# Patient Record
Sex: Male | Born: 1955 | Race: Black or African American | Hispanic: No | Marital: Single | Smoking: Former smoker
Health system: Southern US, Community
[De-identification: ages and names within clinical notes are randomized; demographics above are authoritative.]

## PROBLEM LIST (undated history)

## (undated) DIAGNOSIS — I509 Heart failure, unspecified: Secondary | ICD-10-CM

## (undated) DIAGNOSIS — G8190 Hemiplegia, unspecified affecting unspecified side: Secondary | ICD-10-CM

## (undated) DIAGNOSIS — I2510 Atherosclerotic heart disease of native coronary artery without angina pectoris: Secondary | ICD-10-CM

## (undated) DIAGNOSIS — I252 Old myocardial infarction: Secondary | ICD-10-CM

## (undated) DIAGNOSIS — K859 Acute pancreatitis without necrosis or infection, unspecified: Secondary | ICD-10-CM

## (undated) DIAGNOSIS — E119 Type 2 diabetes mellitus without complications: Secondary | ICD-10-CM

## (undated) DIAGNOSIS — I639 Cerebral infarction, unspecified: Secondary | ICD-10-CM

---

## 2014-08-13 ENCOUNTER — Encounter (HOSPITAL_COMMUNITY): Payer: Self-pay | Admitting: Emergency Medicine

## 2014-08-13 ENCOUNTER — Emergency Department (HOSPITAL_COMMUNITY)
Admission: EM | Admit: 2014-08-13 | Discharge: 2014-08-13 | Disposition: A | Discharge status: Home / Self Care | Payer: Non-veteran care | Attending: Emergency Medicine | Admitting: Emergency Medicine

## 2014-08-13 ENCOUNTER — Emergency Department (HOSPITAL_COMMUNITY): Payer: Non-veteran care

## 2014-08-13 DIAGNOSIS — Z79899 Other long term (current) drug therapy: Secondary | ICD-10-CM | POA: Insufficient documentation

## 2014-08-13 DIAGNOSIS — Y92410 Unspecified street and highway as the place of occurrence of the external cause: Secondary | ICD-10-CM | POA: Diagnosis not present

## 2014-08-13 DIAGNOSIS — I252 Old myocardial infarction: Secondary | ICD-10-CM | POA: Diagnosis not present

## 2014-08-13 DIAGNOSIS — Z8719 Personal history of other diseases of the digestive system: Secondary | ICD-10-CM | POA: Insufficient documentation

## 2014-08-13 DIAGNOSIS — I509 Heart failure, unspecified: Secondary | ICD-10-CM | POA: Insufficient documentation

## 2014-08-13 DIAGNOSIS — Z7982 Long term (current) use of aspirin: Secondary | ICD-10-CM | POA: Insufficient documentation

## 2014-08-13 DIAGNOSIS — Y9389 Activity, other specified: Secondary | ICD-10-CM | POA: Diagnosis not present

## 2014-08-13 DIAGNOSIS — I69959 Hemiplegia and hemiparesis following unspecified cerebrovascular disease affecting unspecified side: Secondary | ICD-10-CM | POA: Insufficient documentation

## 2014-08-13 DIAGNOSIS — M545 Low back pain, unspecified: Secondary | ICD-10-CM

## 2014-08-13 DIAGNOSIS — IMO0002 Reserved for concepts with insufficient information to code with codable children: Secondary | ICD-10-CM | POA: Insufficient documentation

## 2014-08-13 DIAGNOSIS — I2510 Atherosclerotic heart disease of native coronary artery without angina pectoris: Secondary | ICD-10-CM | POA: Insufficient documentation

## 2014-08-13 DIAGNOSIS — Z87891 Personal history of nicotine dependence: Secondary | ICD-10-CM | POA: Diagnosis not present

## 2014-08-13 HISTORY — DX: Hemiplegia, unspecified affecting unspecified side: G81.90

## 2014-08-13 HISTORY — DX: Heart failure, unspecified: I50.9

## 2014-08-13 HISTORY — DX: Cerebral infarction, unspecified: I63.9

## 2014-08-13 HISTORY — DX: Acute pancreatitis without necrosis or infection, unspecified: K85.90

## 2014-08-13 HISTORY — DX: Atherosclerotic heart disease of native coronary artery without angina pectoris: I25.10

## 2014-08-13 HISTORY — DX: Old myocardial infarction: I25.2

## 2014-08-13 MED ORDER — CYCLOBENZAPRINE HCL 10 MG PO TABS
10.0000 mg | ORAL_TABLET | Freq: Once | ORAL | Status: AC
  Administered 2014-08-13: 10 mg via ORAL
  Filled 2014-08-13: qty 1

## 2014-08-13 MED ORDER — HYDROCODONE-ACETAMINOPHEN 5-325 MG PO TABS: 1.0000 | ORAL_TABLET | ORAL | Status: DC | PRN

## 2014-08-13 MED ORDER — CYCLOBENZAPRINE HCL 10 MG PO TABS: 10.0000 mg | ORAL_TABLET | Freq: Two times a day (BID) | ORAL | Status: DC | PRN

## 2014-08-13 MED ORDER — HYDROCODONE-ACETAMINOPHEN 5-325 MG PO TABS
1.0000 | ORAL_TABLET | Freq: Once | ORAL | Status: AC
  Administered 2014-08-13: 1 via ORAL
  Filled 2014-08-13: qty 1

## 2014-08-13 NOTE — ED Notes (Signed)
[facility] here to provide safe transport back to facility.  NAD upon leaving dept.  
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
## 2014-08-13 NOTE — ED Notes (Signed)
Pt ambulatory with steady gait with cane to BR.

## 2014-08-13 NOTE — ED Notes (Signed)
[facility] contacted to provide transport back to facility. 

## 2014-08-13 NOTE — ED Provider Notes (Signed)
Medical screening examination/treatment/procedure(s) were performed by non-physician practitioner and as supervising physician I was immediately available for consultation/collaboration.   EKG Interpretation None       Emauri Krygier, MD 08/13/14 2345 
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
## 2014-08-13 NOTE — Discharge Instructions (Signed)
Muscle Strain °A muscle strain is an injury that occurs when a muscle is stretched beyond its normal length. Usually a small number of muscle fibers are torn when this happens. Muscle strain is rated in degrees. First-degree strains have the least amount of muscle fiber tearing and pain. Second-degree and third-degree strains have increasingly more tearing and pain.  °Usually, recovery from muscle strain takes 1-2 weeks. Complete healing takes 5-6 weeks.  °CAUSES  °Muscle strain happens when a sudden, violent force placed on a muscle stretches it too far. This may occur with lifting, sports, or a fall.  °RISK FACTORS °Muscle strain is especially common in athletes.  °SIGNS AND SYMPTOMS °At the site of the muscle strain, there may be: °· Pain. °· Bruising. °· Swelling. °· Difficulty using the muscle due to pain or lack of normal function. °DIAGNOSIS  °Your health care provider will perform a physical exam and ask about your medical history. °TREATMENT  °Often, the best treatment for a muscle strain is resting, icing, and applying cold compresses to the injured area.   °HOME CARE INSTRUCTIONS  °· Use the PRICE method of treatment to promote muscle healing during the first 2-3 days after your injury. The PRICE method involves: °¨ Protecting the muscle from being injured again. °¨ Restricting your activity and resting the injured body part. °¨ Icing your injury. To do this, put ice in a plastic bag. Place a towel between your skin and the bag. Then, apply the ice and leave it on from 15-20 minutes each hour. After the third day, switch to moist heat packs. °¨ Apply compression to the injured area with a splint or elastic bandage. Be careful not to wrap it too tightly. This may interfere with blood circulation or increase swelling. °¨ Elevate the injured body part above the level of your heart as often as you can. °· Only take over-the-counter or prescription medicines for pain, discomfort, or fever as directed by your  health care provider. °· Warming up prior to exercise helps to prevent future muscle strains. °SEEK MEDICAL CARE IF:  °· You have increasing pain or swelling in the injured area. °· You have numbness, tingling, or a significant loss of strength in the injured area. °MAKE SURE YOU:  °· Understand these instructions. °· Will watch your condition. °· Will get help right away if you are not doing well or get worse. °Document Released: 12/12/2005 Document Revised: 10/02/2013 Document Reviewed: 07/11/2013 °Exit[employer]® Patient Information ©2015 [employer]. This information is not intended to replace advice given to you by your health care provider. Make sure you discuss any questions you have with your health care provider. °Cryotherapy °Cryotherapy means treatment with cold. Ice or gel packs can be used to reduce both pain and swelling. Ice is the most helpful within the first 24 to 48 hours after an injury or flare-up from overusing a muscle or joint. Sprains, strains, spasms, burning pain, shooting pain, and aches can all be eased with ice. Ice can also be used when recovering from surgery. Ice is effective, has very few side effects, and is safe for most people to use. °PRECAUTIONS  °Ice is not a safe treatment option for people with: °· Raynaud phenomenon. This is a condition affecting small blood vessels in the extremities. Exposure to cold may cause your problems to return. °· Cold hypersensitivity. There are many forms of cold hypersensitivity, including: °· Cold urticaria. Red, itchy hives appear on the skin when the tissues begin to warm after being   iced. °· Cold erythema. This is a red, itchy rash caused by exposure to cold. °· Cold hemoglobinuria. Red blood cells break down when the tissues begin to warm after being iced. The hemoglobin that carry oxygen are passed into the urine because they cannot combine with blood proteins fast enough. °· Numbness or altered sensitivity in the area being iced. °If you have  any of the following conditions, do not use ice until you have discussed cryotherapy with your caregiver: °· Heart conditions, such as arrhythmia, angina, or chronic heart disease. °· High blood pressure. °· Healing wounds or open skin in the area being iced. °· Current infections. °· Rheumatoid arthritis. °· Poor circulation. °· Diabetes. °Ice slows the blood flow in the region it is applied. This is beneficial when trying to stop inflamed tissues from spreading irritating chemicals to surrounding tissues. However, if you expose your skin to cold temperatures for too long or without the proper protection, you can damage your skin or nerves. Watch for signs of skin damage due to cold. °HOME CARE INSTRUCTIONS °Follow these tips to use ice and cold packs safely. °· Place a dry or damp towel between the ice and skin. A damp towel will cool the skin more quickly, so you may need to shorten the time that the ice is used. °· For a more rapid response, add gentle compression to the ice. °· Ice for no more than 10 to 20 minutes at a time. The bonier the area you are icing, the less time it will take to get the benefits of ice. °· Check your skin after 5 minutes to make sure there are no signs of a poor response to cold or skin damage. °· Rest 20 minutes or more between uses. °· Once your skin is numb, you can end your treatment. You can test numbness by very lightly touching your skin. The touch should be so light that you do not see the skin dimple from the pressure of your fingertip. When using ice, most people will feel these normal sensations in this order: cold, burning, aching, and numbness. °· Do not use ice on someone who cannot communicate their responses to pain, such as small children or people with dementia. °HOW TO MAKE AN ICE PACK °Ice packs are the most common way to use ice therapy. Other methods include ice massage, ice baths, and cryosprays. Muscle creams that cause a cold, tingly feeling do not offer the  same benefits that ice offers and should not be used as a substitute unless recommended by your caregiver. °To make an ice pack, do one of the following: °· Place crushed ice or a bag of frozen vegetables in a sealable plastic bag. Squeeze out the excess air. Place this bag inside another plastic bag. Slide the bag into a pillowcase or place a damp towel between your skin and the bag. °· Mix 3 parts water with 1 part rubbing alcohol. Freeze the mixture in a sealable plastic bag. When you remove the mixture from the freezer, it will be slushy. Squeeze out the excess air. Place this bag inside another plastic bag. Slide the bag into a pillowcase or place a damp towel between your skin and the bag. °SEEK MEDICAL CARE IF: °· You develop white spots on your skin. This may give the skin a blotchy (mottled) appearance. °· Your skin turns blue or pale. °· Your skin becomes waxy or hard. °· Your swelling gets worse. °MAKE SURE YOU:  °· Understand these instructions. °·   Will watch your condition. °· Will get help right away if you are not doing well or get worse. °Document Released: 08/08/2011 Document Revised: 04/28/2014 Document Reviewed: 08/08/2011 °ExitCare® Patient Information ©2015 [employer]. This information is not intended to replace advice given to you by your health care provider. Make sure you discuss any questions you have with your health care provider. °Motor Vehicle Collision °It is common to have multiple bruises and sore muscles after a motor vehicle collision (MVC). These tend to feel worse for the first 24 hours. You may have the most stiffness and soreness over the first several hours. You may also feel worse when you wake up the first morning after your collision. After this point, you will usually begin to improve with each day. The speed of improvement often depends on the severity of the collision, the number of injuries, and the location and nature of these injuries. °HOME CARE INSTRUCTIONS °· Put  ice on the injured area. °· Put ice in a plastic bag. °· Place a towel between your skin and the bag. °· Leave the ice on for 15-20 minutes, 3-4 times a day, or as directed by your health care provider. °· Drink enough fluids to keep your urine clear or pale yellow. Do not drink alcohol. °· Take a warm shower or bath once or twice a day. This will increase blood flow to sore muscles. °· You may return to activities as directed by your caregiver. Be careful when lifting, as this may aggravate neck or back pain. °· Only take over-the-counter or prescription medicines for pain, discomfort, or fever as directed by your caregiver. Do not use aspirin. This may increase bruising and bleeding. °SEEK IMMEDIATE MEDICAL CARE IF: °· You have numbness, tingling, or weakness in the arms or legs. °· You develop severe headaches not relieved with medicine. °· You have severe neck pain, especially tenderness in the middle of the back of your neck. °· You have changes in bowel or bladder control. °· There is increasing pain in any area of the body. °· You have shortness of breath, light-headedness, dizziness, or fainting. °· You have chest pain. °· You feel sick to your stomach (nauseous), throw up (vomit), or sweat. °· You have increasing abdominal discomfort. °· There is blood in your urine, stool, or vomit. °· You have pain in your shoulder (shoulder strap areas). °· You feel your symptoms are getting worse. °MAKE SURE YOU: °· Understand these instructions. °· Will watch your condition. °· Will get help right away if you are not doing well or get worse. °Document Released: 12/12/2005 Document Revised: 04/28/2014 Document Reviewed: 05/11/2011 °[employer]® Patient Information ©2015 [employer]. This information is not intended to replace advice given to you by your health care provider. Make sure you discuss any questions you have with your health care provider. ° °

## 2020-04-08 ENCOUNTER — Emergency Department (HOSPITAL_COMMUNITY): Payer: No Typology Code available for payment source

## 2020-04-08 ENCOUNTER — Encounter (HOSPITAL_COMMUNITY): Payer: Self-pay

## 2020-04-08 ENCOUNTER — Other Ambulatory Visit: Payer: Self-pay

## 2020-04-08 ENCOUNTER — Inpatient Hospital Stay (HOSPITAL_COMMUNITY)
Admission: EM | Admit: 2020-04-08 | Discharge: 2020-04-10 | DRG: 637 | Disposition: A | Discharge status: Skilled Nursing Facility | Payer: No Typology Code available for payment source | Source: Skilled Nursing Facility | Attending: Internal Medicine | Admitting: Internal Medicine

## 2020-04-08 DIAGNOSIS — E860 Dehydration: Secondary | ICD-10-CM | POA: Diagnosis present

## 2020-04-08 DIAGNOSIS — E1165 Type 2 diabetes mellitus with hyperglycemia: Secondary | ICD-10-CM | POA: Diagnosis not present

## 2020-04-08 DIAGNOSIS — R739 Hyperglycemia, unspecified: Secondary | ICD-10-CM | POA: Diagnosis not present

## 2020-04-08 DIAGNOSIS — E870 Hyperosmolality and hypernatremia: Secondary | ICD-10-CM | POA: Diagnosis present

## 2020-04-08 DIAGNOSIS — I69354 Hemiplegia and hemiparesis following cerebral infarction affecting left non-dominant side: Secondary | ICD-10-CM

## 2020-04-08 DIAGNOSIS — I2510 Atherosclerotic heart disease of native coronary artery without angina pectoris: Secondary | ICD-10-CM | POA: Diagnosis present

## 2020-04-08 DIAGNOSIS — L97529 Non-pressure chronic ulcer of other part of left foot with unspecified severity: Secondary | ICD-10-CM | POA: Diagnosis present

## 2020-04-08 DIAGNOSIS — I110 Hypertensive heart disease with heart failure: Secondary | ICD-10-CM | POA: Diagnosis present

## 2020-04-08 DIAGNOSIS — Z794 Long term (current) use of insulin: Secondary | ICD-10-CM

## 2020-04-08 DIAGNOSIS — Z20822 Contact with and (suspected) exposure to covid-19: Secondary | ICD-10-CM | POA: Diagnosis present

## 2020-04-08 DIAGNOSIS — I509 Heart failure, unspecified: Secondary | ICD-10-CM | POA: Diagnosis present

## 2020-04-08 DIAGNOSIS — Z7982 Long term (current) use of aspirin: Secondary | ICD-10-CM

## 2020-04-08 DIAGNOSIS — J189 Pneumonia, unspecified organism: Secondary | ICD-10-CM | POA: Diagnosis present

## 2020-04-08 DIAGNOSIS — Z87891 Personal history of nicotine dependence: Secondary | ICD-10-CM

## 2020-04-08 DIAGNOSIS — Z79891 Long term (current) use of opiate analgesic: Secondary | ICD-10-CM

## 2020-04-08 DIAGNOSIS — E11621 Type 2 diabetes mellitus with foot ulcer: Secondary | ICD-10-CM | POA: Diagnosis present

## 2020-04-08 DIAGNOSIS — I252 Old myocardial infarction: Secondary | ICD-10-CM

## 2020-04-08 DIAGNOSIS — I7389 Other specified peripheral vascular diseases: Secondary | ICD-10-CM

## 2020-04-08 DIAGNOSIS — Z79899 Other long term (current) drug therapy: Secondary | ICD-10-CM

## 2020-04-08 HISTORY — DX: Type 2 diabetes mellitus without complications: E11.9

## 2020-04-08 LAB — BLOOD GAS, VENOUS
Acid-base deficit: 4.2 mmol/L — ABNORMAL HIGH (ref 0.0–2.0)
Bicarbonate: 21.2 mmol/L (ref 20.0–28.0)
O2 Saturation: 50 %
Patient temperature: 98.6
pCO2, Ven: 42.3 mmHg — ABNORMAL LOW (ref 44.0–60.0)
pH, Ven: 7.32 (ref 7.250–7.430)
pO2, Ven: 32.4 mmHg (ref 32.0–45.0)

## 2020-04-08 LAB — CBC WITH DIFFERENTIAL/PLATELET
Abs Immature Granulocytes: 0.07 10*3/uL (ref 0.00–0.07)
Basophils Absolute: 0.1 10*3/uL (ref 0.0–0.1)
Basophils Relative: 1 %
Eosinophils Absolute: 0.2 10*3/uL (ref 0.0–0.5)
Eosinophils Relative: 2 %
HCT: 36.6 % — ABNORMAL LOW (ref 39.0–52.0)
Hemoglobin: 11.8 g/dL — ABNORMAL LOW (ref 13.0–17.0)
Immature Granulocytes: 1 %
Lymphocytes Relative: 18 %
Lymphs Abs: 1.9 10*3/uL (ref 0.7–4.0)
MCH: 30.6 pg (ref 26.0–34.0)
MCHC: 32.2 g/dL (ref 30.0–36.0)
MCV: 94.8 fL (ref 80.0–100.0)
Monocytes Absolute: 1.1 10*3/uL — ABNORMAL HIGH (ref 0.1–1.0)
Monocytes Relative: 10 %
Neutro Abs: 7.3 10*3/uL (ref 1.7–7.7)
Neutrophils Relative %: 68 %
Platelets: 473 10*3/uL — ABNORMAL HIGH (ref 150–400)
RBC: 3.86 MIL/uL — ABNORMAL LOW (ref 4.22–5.81)
RDW: 13.3 % (ref 11.5–15.5)
WBC: 10.7 10*3/uL — ABNORMAL HIGH (ref 4.0–10.5)
nRBC: 0 % (ref 0.0–0.2)

## 2020-04-08 LAB — COMPREHENSIVE METABOLIC PANEL
ALT: 31 U/L (ref 0–44)
AST: 23 U/L (ref 15–41)
Albumin: 2.9 g/dL — ABNORMAL LOW (ref 3.5–5.0)
Alkaline Phosphatase: 87 U/L (ref 38–126)
Anion gap: 10 (ref 5–15)
BUN: 34 mg/dL — ABNORMAL HIGH (ref 8–23)
CO2: 19 mmol/L — ABNORMAL LOW (ref 22–32)
Calcium: 7.9 mg/dL — ABNORMAL LOW (ref 8.9–10.3)
Chloride: 107 mmol/L (ref 98–111)
Creatinine, Ser: 1.47 mg/dL — ABNORMAL HIGH (ref 0.61–1.24)
GFR calc Af Amer: 57 mL/min — ABNORMAL LOW (ref >60)
GFR calc non Af Amer: 49 mL/min — ABNORMAL LOW (ref >60)
Glucose, Bld: 614 mg/dL (ref 70–99)
Potassium: 4.7 mmol/L (ref 3.5–5.1)
Sodium: 136 mmol/L (ref 135–145)
Total Bilirubin: 0.6 mg/dL (ref 0.3–1.2)
Total Protein: 5.7 g/dL — ABNORMAL LOW (ref 6.5–8.1)

## 2020-04-08 LAB — URINALYSIS, ROUTINE W REFLEX MICROSCOPIC
Bacteria, UA: NONE SEEN
Bilirubin Urine: NEGATIVE
Glucose, UA: >=500 mg/dL — AB
Hgb urine dipstick: NEGATIVE
Ketones, ur: NEGATIVE mg/dL
Leukocytes,Ua: NEGATIVE
Nitrite: NEGATIVE
Protein, ur: NEGATIVE mg/dL
Specific Gravity, Urine: 1.018 (ref 1.005–1.030)
pH: 5 (ref 5.0–8.0)

## 2020-04-08 LAB — HEMOGLOBIN A1C
Hgb A1c MFr Bld: 10.1 % — ABNORMAL HIGH (ref 4.8–5.6)
Mean Plasma Glucose: 243.17 mg/dL

## 2020-04-08 LAB — CBG MONITORING, ED
Glucose-Capillary: >600 mg/dL (ref 70–99)
Glucose-Capillary: 542 mg/dL (ref 70–99)
Glucose-Capillary: 388 mg/dL — ABNORMAL HIGH (ref 70–99)

## 2020-04-08 LAB — GLUCOSE, CAPILLARY
Glucose-Capillary: 110 mg/dL — ABNORMAL HIGH (ref 70–99)
Glucose-Capillary: 142 mg/dL — ABNORMAL HIGH (ref 70–99)
Glucose-Capillary: 185 mg/dL — ABNORMAL HIGH (ref 70–99)
Glucose-Capillary: 284 mg/dL — ABNORMAL HIGH (ref 70–99)
Glucose-Capillary: 98 mg/dL (ref 70–99)

## 2020-04-08 LAB — BETA-HYDROXYBUTYRIC ACID: Beta-Hydroxybutyric Acid: 0.81 mmol/L — ABNORMAL HIGH (ref 0.05–0.27)

## 2020-04-08 LAB — OSMOLALITY: Osmolality: 315 mOsm/kg — ABNORMAL HIGH (ref 275–295)

## 2020-04-08 LAB — MRSA PCR SCREENING: MRSA by PCR: NEGATIVE

## 2020-04-08 MED ORDER — LACTATED RINGERS IV SOLN: INTRAVENOUS | Status: DC

## 2020-04-08 MED ORDER — ACETAMINOPHEN 500 MG PO TABS: 500.0000 mg | ORAL_TABLET | Freq: Four times a day (QID) | ORAL | Status: DC | PRN

## 2020-04-08 MED ORDER — ATORVASTATIN CALCIUM 40 MG PO TABS
80.0000 mg | ORAL_TABLET | Freq: Every day | ORAL | Status: DC
  Administered 2020-04-08 – 2020-04-09 (×2): 80 mg via ORAL
  Filled 2020-04-08 (×2): qty 2

## 2020-04-08 MED ORDER — DEXTROSE-NACL 5-0.45 % IV SOLN: INTRAVENOUS | Status: DC

## 2020-04-08 MED ORDER — POTASSIUM CHLORIDE 10 MEQ/100ML IV SOLN
10.0000 meq | INTRAVENOUS | Status: AC
  Administered 2020-04-08 (×2): 10 meq via INTRAVENOUS
  Filled 2020-04-08 (×2): qty 100

## 2020-04-08 MED ORDER — ASPIRIN EC 81 MG PO TBEC
81.0000 mg | DELAYED_RELEASE_TABLET | Freq: Every day | ORAL | Status: DC
  Administered 2020-04-09 – 2020-04-10 (×2): 81 mg via ORAL
  Filled 2020-04-08 (×2): qty 1

## 2020-04-08 MED ORDER — GABAPENTIN 300 MG PO CAPS
300.0000 mg | ORAL_CAPSULE | Freq: Every day | ORAL | Status: DC
  Administered 2020-04-09 – 2020-04-10 (×2): 300 mg via ORAL
  Filled 2020-04-08 (×2): qty 1

## 2020-04-08 MED ORDER — SODIUM CHLORIDE 0.9 % IV SOLN
1.0000 g | INTRAVENOUS | Status: DC
  Administered 2020-04-08 – 2020-04-09 (×2): 1 g via INTRAVENOUS
  Filled 2020-04-08: qty 1
  Filled 2020-04-08 (×3): qty 10
  Filled 2020-04-08: qty 1

## 2020-04-08 MED ORDER — DEXTROSE 50 % IV SOLN: 0.0000 mL | INTRAVENOUS | Status: DC | PRN

## 2020-04-08 MED ORDER — SODIUM CHLORIDE 0.9 % IV BOLUS
1000.0000 mL | Freq: Once | INTRAVENOUS | Status: AC
  Administered 2020-04-08: 15:00:00 1000 mL via INTRAVENOUS

## 2020-04-08 MED ORDER — CHLORHEXIDINE GLUCONATE CLOTH 2 % EX PADS
6.0000 | MEDICATED_PAD | Freq: Every day | CUTANEOUS | Status: DC
  Administered 2020-04-08 – 2020-04-09 (×2): 6 via TOPICAL

## 2020-04-08 MED ORDER — LACTATED RINGERS IV SOLN: INTRAVENOUS | Status: AC

## 2020-04-08 MED ORDER — SODIUM CHLORIDE 0.9 % IV SOLN: INTRAVENOUS | Status: DC

## 2020-04-08 MED ORDER — ENOXAPARIN SODIUM 40 MG/0.4ML ~~LOC~~ SOLN
40.0000 mg | SUBCUTANEOUS | Status: DC
  Administered 2020-04-08 – 2020-04-09 (×2): 40 mg via SUBCUTANEOUS
  Filled 2020-04-08: qty 0.4

## 2020-04-08 MED ORDER — AZITHROMYCIN 250 MG PO TABS
500.0000 mg | ORAL_TABLET | Freq: Every day | ORAL | Status: DC
  Administered 2020-04-08 – 2020-04-10 (×3): 500 mg via ORAL
  Filled 2020-04-08 (×3): qty 2

## 2020-04-08 MED ORDER — INSULIN REGULAR(HUMAN) IN NACL 100-0.9 UT/100ML-% IV SOLN
INTRAVENOUS | Status: DC
  Administered 2020-04-08: 16:00:00 7 [IU]/h via INTRAVENOUS
  Filled 2020-04-08: qty 100

## 2020-04-08 MED ORDER — ONDANSETRON HCL 4 MG PO TABS: 4.0000 mg | ORAL_TABLET | Freq: Four times a day (QID) | ORAL | Status: DC | PRN

## 2020-04-08 MED ORDER — POLYETHYLENE GLYCOL 3350 17 G PO PACK
17.0000 g | PACK | Freq: Every day | ORAL | Status: DC
  Administered 2020-04-09 – 2020-04-10 (×2): 17 g via ORAL
  Filled 2020-04-08 (×2): qty 1

## 2020-04-08 MED ORDER — ONDANSETRON HCL 4 MG/2ML IJ SOLN: 4.0000 mg | Freq: Four times a day (QID) | INTRAMUSCULAR | Status: DC | PRN

## 2020-04-08 MED ORDER — FERROUS SULFATE 325 (65 FE) MG PO TABS
325.0000 mg | ORAL_TABLET | Freq: Every day | ORAL | Status: DC
  Administered 2020-04-09 – 2020-04-10 (×2): 325 mg via ORAL
  Filled 2020-04-08 (×2): qty 1

## 2020-04-08 NOTE — ED Triage Notes (Signed)
Pt BIB EMS from [facility] [address]. Facility reports glucose has been reading high for a few days. EMS glucometer read "high". A&O x4 and ambulatory. Denies any symptoms.   RFA 20G NS

## 2020-04-08 NOTE — ED Notes (Signed)
Date and time results received: 04/08/20 3:30 PM  (use smartphrase ".now" to insert current time)  Test: Glucose Critical Value: [phone]  Name of Provider Notified: Nanavati  Orders Received? Or Actions Taken?: Orders Received - See Orders for details

## 2020-04-08 NOTE — ED Provider Notes (Signed)
[facility]-EMERGENCY DEPT Provider Note   CSN: [id number] Arrival date & time: 04/08/20  1413     History Chief Complaint  Patient presents with  . Hyperglycemia    Ethan Diaz is a 64 y.o. male.  HPI    64 year old comes in a chief complaint of elevated blood sugar.  He has history of CHF, CAD, diabetes, stroke.  He is taking insulin and Metformin for his diabetes, with the latter being started just yesterday as his sugar has been reading high at the nursing home.  Patient denies any UTI-like symptoms, fevers, chills, cough, chest pain.  He reports that his sugar has been running high for the last several days.  He used to be on Metformin and was switched to insulin 2 or 3 months ago.  There has been no other changes to his medications besides restarting Metformin yesterday.  Past Medical History:  Diagnosis Date  . CHF (congestive heart failure) (HCC)   . Coronary artery disease   . Diabetes mellitus without complication (HCC)   . Hemiparesis (HCC)   . MI, old   . Pancreatitis   . Stroke [facility])     Patient Active Problem List   Diagnosis Date Noted  . Hyperglycemia 04/08/2020  . 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 No pertinent surgical history.     History reviewed. No pertinent family history.  Social History   Tobacco Use  . Smoking status: Former Games developer  . Smokeless tobacco: Never Used  Substance Use Topics  . Alcohol use: Yes    Comment: 4oz red wine 2x/day  . Drug use: No    Home Medications Prior to Admission medications   Medication Sig Start Date End Date Taking? Authorizing Provider  acetaminophen (TYLENOL) 500 MG tablet Take 500 mg by mouth at bedtime.    Yes [provider]  aspirin EC 81 MG tablet Take 81 mg by mouth daily.   Yes [provider]  atorvastatin (LIPITOR) 80 MG tablet Take 80 mg by mouth at bedtime.   Yes [provider]  cholecalciferol (VITAMIN D3) 25 MCG  (1000 UNIT) tablet Take 1,000 Units by mouth daily.   Yes [provider]  docusate sodium (COLACE) 100 MG capsule Take 100 mg by mouth daily.   Yes [provider]  empagliflozin (JARDIANCE) 25 MG TABS tablet Take 12.5 mg by mouth daily.   Yes [provider]  ferrous sulfate 325 (65 FE) MG tablet Take 325 mg by mouth daily with breakfast.   Yes [provider]  furosemide (LASIX) 20 MG tablet Take 20 mg by mouth every other day.   Yes [provider]  gabapentin (NEURONTIN) 300 MG capsule Take 300 mg by mouth daily.   Yes [provider]  Glucerna (GLUCERNA) LIQD Take 237 mLs by mouth daily.   Yes [provider]  insulin aspart protamine- aspart (NOVOLOG MIX 70/30) (70-30) 100 UNIT/ML injection Inject 8-10 Units into the skin See admin instructions. Inject 8 units in AM inject 10 units in evening   Yes [provider]  lidocaine (LMX) 4 % cream Apply 1 application topically as needed (toe).   Yes [provider]  Lidocaine-Glycerin (PREPARATION H EX) Apply 1 application topically every 8 (eight) hours as needed (hemorrhoids).   Yes [provider]  losartan (COZAAR) 50 MG tablet Take 50 mg by mouth daily.   Yes [provider]  Multiple Vitamins-Minerals (CENTRUM SILVER 50+MEN PO) Take 1 tablet by  mouth daily.   Yes [provider]  polyethylene glycol (MIRALAX / GLYCOLAX) 17 g packet Take 17 g by mouth daily.   Yes [provider]  cyclobenzaprine (FLEXERIL) 10 MG tablet Take 1 tablet (10 mg total) by mouth 2 (two) times daily as needed for muscle spasms. Patient not taking: Reported on 04/08/2020 08/13/14   Charlann Lange, PA-C  HYDROcodone-acetaminophen (NORCO/VICODIN) 5-325 MG per tablet Take 1-2 tablets by mouth every 4 (four) hours as needed. Patient not taking: Reported on 04/08/2020 08/13/14   Charlann Lange, PA-C    Allergies    Patient has no known allergies.  Review of  Systems   Review of Systems  Constitutional: Positive for activity change.  Respiratory: Negative for shortness of breath.   Cardiovascular: Negative for chest pain.  Gastrointestinal: Negative for nausea and vomiting.  Genitourinary: Negative for dysuria.  Hematological: Does not bruise/bleed easily.  All other systems reviewed and are negative.   Physical Exam Updated Vital Signs BP (!) 120/98   Pulse 94   Temp 98.4 F (36.9 C) (Oral)   Resp 16   Ht 5\' 6"  (1.676 m)   Wt 66.2 kg   SpO2 97%   BMI 23.57 kg/m   Physical Exam Vitals and nursing note reviewed.  Constitutional:      Appearance: He is well-developed.  HENT:     Head: Atraumatic.  Cardiovascular:     Rate and Rhythm: Normal rate.  Pulmonary:     Effort: Pulmonary effort is normal.  Musculoskeletal:     Cervical back: Neck supple.  Skin:    General: Skin is warm.  Neurological:     Mental Status: He is alert and oriented to person, place, and time.     ED Results / Procedures / Treatments   Labs (all labs ordered are listed, but only abnormal results are displayed) Labs Reviewed  COMPREHENSIVE METABOLIC PANEL - Abnormal; Notable for the following components:      Result Value   CO2 19 (*)    Glucose, Bld 614 (*)    BUN 34 (*)    Creatinine, Ser 1.47 (*)    Calcium 7.9 (*)    Total Protein 5.7 (*)    Albumin 2.9 (*)    GFR calc non Af Amer 49 (*)    GFR calc Af Amer 57 (*)    All other components within normal limits  CBC WITH DIFFERENTIAL/PLATELET - Abnormal; Notable for the following components:   WBC 10.7 (*)    RBC 3.86 (*)    Hemoglobin 11.8 (*)    HCT 36.6 (*)    Platelets 473 (*)    Monocytes Absolute 1.1 (*)    All other components within normal limits  BLOOD GAS, VENOUS - Abnormal; Notable for the following components:   pCO2, Ven 42.3 (*)    Acid-base deficit 4.2 (*)    All other components within normal limits  BETA-HYDROXYBUTYRIC ACID - Abnormal; Notable for the following  components:   Beta-Hydroxybutyric Acid 0.81 (*)    All other components within normal limits  URINALYSIS, ROUTINE W REFLEX MICROSCOPIC - Abnormal; Notable for the following components:   Color, Urine STRAW (*)    Glucose, UA >=500 (*)    All other components within normal limits  CBG MONITORING, ED - Abnormal; Notable for the following components:   Glucose-Capillary >600 (*)    All other components within normal limits  CBG MONITORING, ED - Abnormal; Notable for the following components:  Glucose-Capillary 542 (*)    All other components within normal limits  CBG MONITORING, ED - Abnormal; Notable for the following components:   Glucose-Capillary 388 (*)    All other components within normal limits  SARS CORONAVIRUS 2 (TAT 6-24 HRS)  BASIC METABOLIC PANEL  BASIC METABOLIC PANEL  BASIC METABOLIC PANEL  OSMOLALITY    EKG None  Radiology DG Chest Port 1 View  Result Date: 04/08/2020 CLINICAL DATA:  Elevated glucose EXAM: PORTABLE CHEST 1 VIEW COMPARISON:  None. FINDINGS: Cardiac shadows within normal limits. Aortic calcifications are noted. The lungs are well aerated bilaterally. Right medial basilar consolidation is noted with mild volume loss and mediastinal shift to the right. No bony abnormality is noted. IMPRESSION: Consolidation in the right medial lung base consistent with acute infiltrate. Electronically Signed   By: Alcide Clever M.D.   On: 04/08/2020 16:16    Procedures .Critical Care Performed by: Derwood Kaplan, MD Authorized by: Derwood Kaplan, MD   Critical care provider statement:    Critical care time (minutes):  35   Critical care was necessary to treat or prevent imminent or life-threatening deterioration of the following conditions:  Metabolic crisis   Critical care was time spent personally by me on the following activities:  Discussions with consultants, evaluation of patient's response to treatment, examination of patient, ordering and performing  treatments and interventions, ordering and review of laboratory studies, ordering and review of radiographic studies, pulse oximetry, re-evaluation of patient's condition, obtaining history from patient or surrogate and review of old charts   (including critical care time)  Medications Ordered in ED Medications  insulin regular, human (MYXREDLIN) 100 units/ 100 mL infusion (7 Units/hr Intravenous New Bag/Given 04/08/20 1626)  0.9 %  sodium chloride infusion (has no administration in time range)  dextrose 5 %-0.45 % sodium chloride infusion ( Intravenous Not Given 04/08/20 1601)  dextrose 50 % solution 0-50 mL (has no administration in time range)  potassium chloride 10 mEq in 100 mL IVPB (10 mEq Intravenous New Bag/Given 04/08/20 1630)  lactated ringers infusion (has no administration in time range)  cefTRIAXone (ROCEPHIN) 1 g in sodium chloride 0.9 % 100 mL IVPB (has no administration in time range)  azithromycin (ZITHROMAX) tablet 500 mg (has no administration in time range)  sodium chloride 0.9 % bolus 1,000 mL (1,000 mLs Intravenous New Bag/Given 04/08/20 1514)    ED Course  I have reviewed the triage vital signs and the nursing notes.  Pertinent labs & imaging results that were available during my care of the patient were reviewed by me and considered in my medical decision making (see chart for details).    MDM Rules/Calculators/A&P                      64 year old comes in a chief complaint of elevated blood sugar. Patient is noted to have hyperglycemia without anion gap.  He is suspected to have HHS right now.  We will admit him to the hospital for further optimization of his diabetes medications.  Clinically there does not appear to be underlying infectious process as the underlying reason.  Reassessment: Patient's blood sugars are improving.  UA looks clean.  Hospitalist admitting.  Final Clinical Impression(s) / ED Diagnoses Final diagnoses:  HHS (hypothenar hammer syndrome)  (HCC)    Rx / DC Orders ED Discharge Orders    None       Derwood Kaplan, MD 04/08/20 1708

## 2020-04-08 NOTE — H&P (Addendum)
History and Physical    Ethan Diaz UMP:[id number] DOB: 01-Sep-1956 DOA: 04/08/2020  Referring MD/NP/PA: EDP PCP: Dr. Jeri Lager at [facility] Patient coming from: Assisted living facility  Chief Complaint: High blood sugars  HPI: Ethan Diaz is a 64 y.o. male with medical history significant for type 2 diabetes mellitus, peripheral neuropathy recently started on insulin, history of CVA with left hemiplegia, history of CHF, resident of assisted living facility was sent to the emergency room as a result of high blood sugars today. -Patient denies any complaints he reports that his sugars have been running high for the past couple of weeks, he was started on insulin approximately a month ago by his report, unable to tell me what type of insulin or how much he uses. -He denies any nausea vomiting, chest or abdominal pain, fever or chills, denies any respiratory symptoms at this time either ED Course: CBGs were greater than 600, edema noted normal sodium, CO2 of 19, blood glucose of 614 with normal anion gap, white count of 10.7, ABG with pH of 7.32 -He was started on IV fluids and insulin drip in the ED, most recent blood sugar now is 542  Review of Systems: As per HPI otherwise 14 point review of systems negative.   Past Medical History:  Diagnosis Date  . CHF (congestive heart failure) (HCC)   . Coronary artery disease   . Diabetes mellitus without complication (HCC)   . Hemiparesis (HCC)   . MI, old   . Pancreatitis   . Stroke [facility])     History reviewed. No pertinent surgical history.   reports that he has quit smoking. He has never used smokeless tobacco. He reports current alcohol use. He reports that he does not use drugs.  No Known Allergies  History reviewed. No pertinent family history.   Prior to Admission medications   Medication Sig Start Date End Date Taking? Authorizing Provider  acetaminophen (TYLENOL) 500 MG tablet Take 500 mg by mouth at bedtime.    Yes [provider]  aspirin EC 81 MG tablet Take 81 mg by mouth daily.   Yes [provider]  atorvastatin (LIPITOR) 80 MG tablet Take 80 mg by mouth at bedtime.   Yes [provider]  cholecalciferol (VITAMIN D3) 25 MCG (1000 UNIT) tablet Take 1,000 Units by mouth daily.   Yes [provider]  docusate sodium (COLACE) 100 MG capsule Take 100 mg by mouth daily.   Yes [provider]  empagliflozin (JARDIANCE) 25 MG TABS tablet Take 12.5 mg by mouth daily.   Yes [provider]  ferrous sulfate 325 (65 FE) MG tablet Take 325 mg by mouth daily with breakfast.   Yes [provider]  furosemide (LASIX) 20 MG tablet Take 20 mg by mouth every other day.   Yes [provider]  gabapentin (NEURONTIN) 300 MG capsule Take 300 mg by mouth daily.   Yes [provider]  Glucerna (GLUCERNA) LIQD Take 237 mLs by mouth daily.   Yes [provider]  insulin aspart protamine- aspart (NOVOLOG MIX 70/30) (70-30) 100 UNIT/ML injection Inject 8-10 Units into the skin See admin instructions. Inject 8 units in AM inject 10 units in evening   Yes [provider]  lidocaine (LMX) 4 % cream Apply 1 application topically as needed (toe).   Yes [provider]  Lidocaine-Glycerin (PREPARATION H EX) Apply 1 application topically every 8 (eight) hours as needed (hemorrhoids).   Yes [provider]  losartan (COZAAR) 50 MG tablet Take 50 mg by mouth daily.   Yes [provider]  Multiple Vitamins-Minerals (CENTRUM SILVER 50+MEN PO) Take 1 tablet by mouth daily.   Yes [provider]  polyethylene glycol (MIRALAX / GLYCOLAX) 17 g packet Take 17 g by mouth daily.   Yes [provider]  cyclobenzaprine (FLEXERIL) 10 MG tablet Take 1 tablet (10 mg total) by mouth 2 (two) times daily as needed for muscle spasms. Patient not taking: Reported on 04/08/2020 08/13/14   Charlann Lange, PA-C    HYDROcodone-acetaminophen (NORCO/VICODIN) 5-325 MG per tablet Take 1-2 tablets by mouth every 4 (four) hours as needed. Patient not taking: Reported on 04/08/2020 08/13/14   Charlann Lange, PA-C    Physical Exam: Vitals:   04/08/20 1427 04/08/20 1456 04/08/20 1628  BP: 100/68 91/67   Pulse: 99 92   Resp: 20 (!) 22   Temp: 98.4 F (36.9 C)    TempSrc: Oral    SpO2: 96% 95%   Weight:   66.2 kg  Height:   5\' 6"  (1.676 m)      Constitutional: Middle-aged pleasant male sitting up in bed, AAOx3 Vitals:   04/08/20 1427 04/08/20 1456 04/08/20 1628  BP: 100/68 91/67   Pulse: 99 92   Resp: 20 (!) 22   Temp: 98.4 F (36.9 C)    TempSrc: Oral    SpO2: 96% 95%   Weight:   66.2 kg  Height:   5\' 6"  (1.676 m)   HEENT: Oral mucosa moist and pink, neck no JVD Respiratory: Decreased breath sounds bases, otherwise clear Cardiovascular: Regular rate and rhythm, no murmurs / rubs / gallops Abdomen: soft, non tender, Bowel sounds positive.  Ext: No edema, small ulcer medial aspect of left foot, with surrounding thickening skin, no signs of infection Skin: As above Neurologic: Left hemiplegia Psychiatric: Normal judgment and insight. Alert and oriented x 3. Normal mood.   Labs on Admission: I have personally reviewed following labs and imaging studies  CBC: Recent Labs  Lab 04/08/20 1454  WBC 10.7*  NEUTROABS 7.3  HGB 11.8*  HCT 36.6*  MCV 94.8  PLT 696*   Basic Metabolic Panel: Recent Labs  Lab 04/08/20 1454  NA 136  K 4.7  CL 107  CO2 19*  GLUCOSE 614*  BUN 34*  CREATININE 1.47*  CALCIUM 7.9*   GFR: Estimated Creatinine Clearance: 45.2 mL/min (A) (by C-G formula based on SCr of 1.47 mg/dL (H)). Liver Function Tests: Recent Labs  Lab 04/08/20 1454  AST 23  ALT 31  ALKPHOS 87  BILITOT 0.6  PROT 5.7*  ALBUMIN 2.9*   No results for input(s): LIPASE, AMYLASE in the last 168 hours. No results for input(s): AMMONIA in the last 168 hours. Coagulation Profile: No  results for input(s): INR, PROTIME in the last 168 hours. Cardiac Enzymes: No results for input(s): CKTOTAL, CKMB, CKMBINDEX, TROPONINI in the last 168 hours. BNP (last 3 results) No results for input(s): PROBNP in the last 8760 hours. HbA1C: No results for input(s): HGBA1C in the last 72 hours. CBG: Recent Labs  Lab 04/08/20 1427 04/08/20 1621  GLUCAP >600* 542*   Lipid Profile: No results for input(s): CHOL, HDL, LDLCALC, TRIG, CHOLHDL, LDLDIRECT in the last 72 hours. Thyroid Function Tests: No results for input(s): TSH, T4TOTAL, FREET4, T3FREE, THYROIDAB in the last 72 hours. Anemia Panel: No results for input(s): VITAMINB12, FOLATE, FERRITIN, TIBC, IRON, RETICCTPCT in the last 72 hours. Urine analysis: No results found for:  COLORURINE, APPEARANCEUR, LABSPEC, PHURINE, GLUCOSEU, HGBUR, BILIRUBINUR, KETONESUR, PROTEINUR, UROBILINOGEN, NITRITE, LEUKOCYTESUR Sepsis Labs: @LABRCNTIP (procalcitonin:4,lacticidven:4) )No results found for this or any previous visit (from the past 240 hour(s)).   Radiological Exams on Admission: DG Chest Port 1 View  Result Date: 04/08/2020 CLINICAL DATA:  Elevated glucose EXAM: PORTABLE CHEST 1 VIEW COMPARISON:  None. FINDINGS: Cardiac shadows within normal limits. Aortic calcifications are noted. The lungs are well aerated bilaterally. Right medial basilar consolidation is noted with mild volume loss and mediastinal shift to the right. No bony abnormality is noted. IMPRESSION: Consolidation in the right medial lung base consistent with acute infiltrate. Electronically Signed   By: 04/10/2020 M.D.   On: 04/08/2020 16:16    EKG: Independently reviewed.  Nonspecific ST-T wave changes, no old EKGs for comparison  Assessment/Plan  Hyperosmolar nonketotic hyperglycemia Uncontrolled type 2 diabetes mellitus Dehydration from osmotic diuresis -Unclear trigger, it appears that his sugars have been running high for several weeks, patient reports that he was  started on insulin not too long ago -Continue insulin drip per Endo tool protocol and IV fluids -No evidence of DKA at this time, anion gap is normal and pH is normal -Check hemoglobin A1c -Once blood sugars less than 250, 300 can transition to Lantus and sliding scale -Diabetes coordinator consult  Addendum: CXR with ? R lung infiltrate, no clear symptoms, will start empiric Ceftriaxone/azithromycin today  History of CVA Left hemiplegia -continue aspirin and statin  History of CHF -Clinically dry at this time, holding Lasix  Hypertension -Blood pressure is soft, hold losartan  Small foot ulcer -Recently seen by podiatrist, clean-based ulcer without signs or symptoms of infection, follow-up with podiatrist  DVT prophylaxis: Lovenox Code Status: Full code Family Communication: Discussed with patient in detail, no family at bedside Disposition Plan: Back to ALF tomorrow if blood sugars are stable Admission status: Observation  04/10/2020 MD [facility]   04/08/2020, 4:34 PM

## 2020-04-09 DIAGNOSIS — E860 Dehydration: Secondary | ICD-10-CM | POA: Diagnosis present

## 2020-04-09 DIAGNOSIS — R739 Hyperglycemia, unspecified: Secondary | ICD-10-CM | POA: Diagnosis present

## 2020-04-09 DIAGNOSIS — L97529 Non-pressure chronic ulcer of other part of left foot with unspecified severity: Secondary | ICD-10-CM | POA: Diagnosis present

## 2020-04-09 DIAGNOSIS — E870 Hyperosmolality and hypernatremia: Secondary | ICD-10-CM | POA: Diagnosis not present

## 2020-04-09 DIAGNOSIS — Z7982 Long term (current) use of aspirin: Secondary | ICD-10-CM | POA: Diagnosis not present

## 2020-04-09 DIAGNOSIS — E11621 Type 2 diabetes mellitus with foot ulcer: Secondary | ICD-10-CM | POA: Diagnosis present

## 2020-04-09 DIAGNOSIS — Z794 Long term (current) use of insulin: Secondary | ICD-10-CM | POA: Diagnosis not present

## 2020-04-09 DIAGNOSIS — I2510 Atherosclerotic heart disease of native coronary artery without angina pectoris: Secondary | ICD-10-CM | POA: Diagnosis present

## 2020-04-09 DIAGNOSIS — I110 Hypertensive heart disease with heart failure: Secondary | ICD-10-CM | POA: Diagnosis present

## 2020-04-09 DIAGNOSIS — J189 Pneumonia, unspecified organism: Secondary | ICD-10-CM | POA: Diagnosis present

## 2020-04-09 DIAGNOSIS — I69354 Hemiplegia and hemiparesis following cerebral infarction affecting left non-dominant side: Secondary | ICD-10-CM | POA: Diagnosis not present

## 2020-04-09 DIAGNOSIS — I252 Old myocardial infarction: Secondary | ICD-10-CM | POA: Diagnosis not present

## 2020-04-09 DIAGNOSIS — Z79891 Long term (current) use of opiate analgesic: Secondary | ICD-10-CM | POA: Diagnosis not present

## 2020-04-09 DIAGNOSIS — E1165 Type 2 diabetes mellitus with hyperglycemia: Secondary | ICD-10-CM | POA: Diagnosis present

## 2020-04-09 DIAGNOSIS — I509 Heart failure, unspecified: Secondary | ICD-10-CM | POA: Diagnosis present

## 2020-04-09 DIAGNOSIS — Z20822 Contact with and (suspected) exposure to covid-19: Secondary | ICD-10-CM | POA: Diagnosis present

## 2020-04-09 DIAGNOSIS — Z87891 Personal history of nicotine dependence: Secondary | ICD-10-CM | POA: Diagnosis not present

## 2020-04-09 DIAGNOSIS — Z79899 Other long term (current) drug therapy: Secondary | ICD-10-CM | POA: Diagnosis not present

## 2020-04-09 LAB — COMPREHENSIVE METABOLIC PANEL
ALT: 33 U/L (ref 0–44)
AST: 33 U/L (ref 15–41)
Albumin: 2.8 g/dL — ABNORMAL LOW (ref 3.5–5.0)
Alkaline Phosphatase: 82 U/L (ref 38–126)
Anion gap: 10 (ref 5–15)
BUN: 24 mg/dL — ABNORMAL HIGH (ref 8–23)
CO2: 19 mmol/L — ABNORMAL LOW (ref 22–32)
Calcium: 8.5 mg/dL — ABNORMAL LOW (ref 8.9–10.3)
Chloride: 113 mmol/L — ABNORMAL HIGH (ref 98–111)
Creatinine, Ser: 1.3 mg/dL — ABNORMAL HIGH (ref 0.61–1.24)
GFR calc Af Amer: >60 mL/min (ref >60)
GFR calc non Af Amer: 57 mL/min — ABNORMAL LOW (ref >60)
Glucose, Bld: 137 mg/dL — ABNORMAL HIGH (ref 70–99)
Potassium: 3.9 mmol/L (ref 3.5–5.1)
Sodium: 142 mmol/L (ref 135–145)
Total Bilirubin: 0.7 mg/dL (ref 0.3–1.2)
Total Protein: 5.9 g/dL — ABNORMAL LOW (ref 6.5–8.1)

## 2020-04-09 LAB — CBC
HCT: 38.4 % — ABNORMAL LOW (ref 39.0–52.0)
Hemoglobin: 12.7 g/dL — ABNORMAL LOW (ref 13.0–17.0)
MCH: 31.9 pg (ref 26.0–34.0)
MCHC: 33.1 g/dL (ref 30.0–36.0)
MCV: 96.5 fL (ref 80.0–100.0)
Platelets: 417 10*3/uL — ABNORMAL HIGH (ref 150–400)
RBC: 3.98 MIL/uL — ABNORMAL LOW (ref 4.22–5.81)
RDW: 13.2 % (ref 11.5–15.5)
WBC: 13.6 10*3/uL — ABNORMAL HIGH (ref 4.0–10.5)
nRBC: 0 % (ref 0.0–0.2)

## 2020-04-09 LAB — GLUCOSE, CAPILLARY
Glucose-Capillary: 94 mg/dL (ref 70–99)
Glucose-Capillary: 124 mg/dL — ABNORMAL HIGH (ref 70–99)
Glucose-Capillary: 126 mg/dL — ABNORMAL HIGH (ref 70–99)
Glucose-Capillary: 142 mg/dL — ABNORMAL HIGH (ref 70–99)
Glucose-Capillary: 187 mg/dL — ABNORMAL HIGH (ref 70–99)
Glucose-Capillary: 172 mg/dL — ABNORMAL HIGH (ref 70–99)
Glucose-Capillary: 155 mg/dL — ABNORMAL HIGH (ref 70–99)
Glucose-Capillary: 143 mg/dL — ABNORMAL HIGH (ref 70–99)
Glucose-Capillary: 168 mg/dL — ABNORMAL HIGH (ref 70–99)
Glucose-Capillary: 95 mg/dL (ref 70–99)
Glucose-Capillary: 87 mg/dL (ref 70–99)

## 2020-04-09 LAB — HIV ANTIBODY (ROUTINE TESTING W REFLEX): HIV Screen 4th Generation wRfx: NONREACTIVE

## 2020-04-09 LAB — SARS CORONAVIRUS 2 (TAT 6-24 HRS): SARS Coronavirus 2: NEGATIVE

## 2020-04-09 MED ORDER — INSULIN GLARGINE 100 UNIT/ML ~~LOC~~ SOLN
20.0000 [IU] | Freq: Every day | SUBCUTANEOUS | Status: DC
  Administered 2020-04-09 – 2020-04-10 (×2): 20 [IU] via SUBCUTANEOUS
  Filled 2020-04-09 (×3): qty 0.2

## 2020-04-09 MED ORDER — INSULIN ASPART 100 UNIT/ML ~~LOC~~ SOLN
0.0000 [IU] | Freq: Three times a day (TID) | SUBCUTANEOUS | Status: DC
  Administered 2020-04-09: 13:00:00 2 [IU] via SUBCUTANEOUS

## 2020-04-09 MED ORDER — INSULIN ASPART 100 UNIT/ML ~~LOC~~ SOLN
4.0000 [IU] | Freq: Three times a day (TID) | SUBCUTANEOUS | Status: DC
  Administered 2020-04-09 (×2): 4 [IU] via SUBCUTANEOUS

## 2020-04-09 NOTE — Progress Notes (Signed)
Provider notified that endo-tool prompted notification and progression to SQ and no active orders present. NP instructed to get BMP and maintain insulin at 0.2mg /hr and notify when labs available.

## 2020-04-09 NOTE — Progress Notes (Signed)
Inpatient Diabetes Program Recommendations  AACE/ADA: New Consensus Statement on Inpatient Glycemic Control (2015)  Target Ranges:  Prepandial:   less than 140 mg/dL      Peak postprandial:   less than 180 mg/dL (1-2 hours)      Critically ill patients:  140 - 180 mg/dL   Lab Results  Component Value Date   GLUCAP 143 (H) 04/09/2020   HGBA1C 10.1 (H) 04/08/2020    Review of Glycemic Control  Diabetes history: DM2 Outpatient Diabetes medications: 70/30 8 units in am and 10 units in pm, Jardiance 12.5 mg QD, previously on metformin Current orders for Inpatient glycemic control: Transitioning off IV insulin to Lantus 20 units QD  HgbA1C - 10.1% Needs Novolog correction and meal coverage.  Inpatient Diabetes Program Recommendations:     Add Novolog 0-9 units tidwc and hs Add Novolog 4 units tidwc for meal coverage insulin if pt eats > 50% meal.   Will speak with pt about HgbA1C of 10.1% and his diabetes control today.  Thank you. Ailene Ards, RD, LDN, CDE Inpatient Diabetes Coordinator [phone]

## 2020-04-09 NOTE — Progress Notes (Signed)
PROGRESS NOTE    Ethan Diaz  DPO:[id number] DOB: 1956/09/20 DOA: 04/08/2020 PCP: [facility]  Brief Narrative:   Ethan Diaz is a 64 y.o. male with medical history significant for type 2 diabetes mellitus, peripheral neuropathy recently started on insulin, history of CVA with left hemiplegia, history of CHF, resident of assisted living facility was sent to the emergency room as a result of high blood sugars. -he reported  that his sugars were been running high for the past couple of weeks, he was started on insulin approximately a month ago by his report, unable to tell me what type of insulin or how much he uses. ED Course: CBGs were greater than 600, edema noted normal sodium, CO2 of 19, blood glucose of 614 with normal anion gap, white count of 10.7, ABG with pH of 7.32 -He was started on IV fluids and insulin drip in the ED and CXR noted RLL pneumonia   Assessment & Plan:   Hyperosmolar nonketotic hyperglycemia Uncontrolled type 2 diabetes mellitus Dehydration from osmotic diuresis -Unclear trigger, it appears that his sugars have been running high for several weeks, patient reports that he was started on insulin not too long ago -Treated with IV fluids and insulin drip -Hemoglobin A1c is 10 -CBGs improved, transition to long-acting insulin and sliding scale today -Start diet -Diabetes coordinator consult, transfer out of ICU  Community-acquired pneumonia versus aspiration -Continue ceftriaxone and azithromycin -Check SLP evaluation today  History of CVA Left hemiplegia -continue aspirin and statin  History of CHF -Appears euvolemic, Lasix on hold  Hypertension -Blood pressure is soft but stable, losartan on hold  Small foot ulcer -Recently seen by podiatrist, clean-based ulcer without signs or symptoms of infection, follow-up with podiatrist  DVT prophylaxis: Lovenox Code Status: Full code Family Communication: Discussed with patient in detail, no  family at bedside Disposition Plan: Back to ALF tomorrow if CBG stable off insulin drip and evaluation for aspiration pneumonia   Procedures:   Antimicrobials:    Subjective: -Feels better today, admits to some, occasional difficulties with swallowing  Objective: Vitals:   04/09/20 0400 04/09/20 0500 04/09/20 0600 04/09/20 0800  BP: (!) 119/94  109/69   Pulse: 96 92 87   Resp: 17 14 14    Temp:    98.3 F (36.8 C)  TempSrc:      SpO2: 95% 94% 94%   Weight:      Height:        Intake/Output Summary (Last 24 hours) at 04/09/2020 1152 Last data filed at 04/09/2020 0535 Gross per 24 hour  Intake 1119.86 ml  Output 675 ml  Net 444.86 ml   Filed Weights   04/08/20 1628  Weight: 66.2 kg    Examination:  General exam: Pleasant elderly male sitting up in bed, AAOx3 Respiratory system: Decreased breath sounds the right base  cardiovascular system: S1 & S2 heard, RRR Gastrointestinal system: Abdomen is nondistended, soft and nontender.Normal bowel sounds heard. Central nervous system: Alert and oriented.  Left hemiplegia Extremities: No edema Skin: Small ulcer at the medial aspect of left foot with surrounding thickened skin, no signs or symptoms of infection Psychiatry: Mood & affect appropriate.     Data Reviewed:   CBC: Recent Labs  Lab 04/08/20 1454 04/09/20 0112  WBC 10.7* 13.6*  NEUTROABS 7.3  --   HGB 11.8* 12.7*  HCT 36.6* 38.4*  MCV 94.8 96.5  PLT 473* 431*   Basic Metabolic Panel: Recent Labs  Lab 04/08/20 1454 04/09/20 0112  NA 136 142  K 4.7 3.9  CL 107 113*  CO2 19* 19*  GLUCOSE 614* 137*  BUN 34* 24*  CREATININE 1.47* 1.30*  CALCIUM 7.9* 8.5*   GFR: Estimated Creatinine Clearance: 51.1 mL/min (A) (by C-G formula based on SCr of 1.3 mg/dL (H)). Liver Function Tests: Recent Labs  Lab 04/08/20 1454 04/09/20 0112  AST 23 33  ALT 31 33  ALKPHOS 87 82  BILITOT 0.6 0.7  PROT 5.7* 5.9*  ALBUMIN 2.9* 2.8*   No results for input(s):  LIPASE, AMYLASE in the last 168 hours. No results for input(s): AMMONIA in the last 168 hours. Coagulation Profile: No results for input(s): INR, PROTIME in the last 168 hours. Cardiac Enzymes: No results for input(s): CKTOTAL, CKMB, CKMBINDEX, TROPONINI in the last 168 hours. BNP (last 3 results) No results for input(s): PROBNP in the last 8760 hours. HbA1C: Recent Labs    04/08/20 1454  HGBA1C 10.1*   CBG: Recent Labs  Lab 04/09/20 0423 04/09/20 0534 04/09/20 0636 04/09/20 0843 04/09/20 1051  GLUCAP 187* 172* 155* 143* 168*   Lipid Profile: No results for input(s): CHOL, HDL, LDLCALC, TRIG, CHOLHDL, LDLDIRECT in the last 72 hours. Thyroid Function Tests: No results for input(s): TSH, T4TOTAL, FREET4, T3FREE, THYROIDAB in the last 72 hours. Anemia Panel: No results for input(s): VITAMINB12, FOLATE, FERRITIN, TIBC, IRON, RETICCTPCT in the last 72 hours. Urine analysis:    Component Value Date/Time   COLORURINE STRAW (A) 04/08/2020 1631   APPEARANCEUR CLEAR 04/08/2020 1631   LABSPEC 1.018 04/08/2020 1631   PHURINE 5.0 04/08/2020 1631   GLUCOSEU >=500 (A) 04/08/2020 1631   HGBUR NEGATIVE 04/08/2020 1631   BILIRUBINUR NEGATIVE 04/08/2020 1631   KETONESUR NEGATIVE 04/08/2020 1631   PROTEINUR NEGATIVE 04/08/2020 1631   NITRITE NEGATIVE 04/08/2020 1631   LEUKOCYTESUR NEGATIVE 04/08/2020 1631   Sepsis Labs: @LABRCNTIP (procalcitonin:4,lacticidven:4)  ) Recent Results (from the past 240 hour(s))  SARS CORONAVIRUS 2 (TAT 6-24 HRS) Nasopharyngeal Nasopharyngeal Swab     Status: None   Collection Time: 04/08/20  4:31 PM   Specimen: Nasopharyngeal Swab  Result Value Ref Range Status   SARS Coronavirus 2 NEGATIVE NEGATIVE Final    Comment: (NOTE) SARS-CoV-2 target nucleic acids are NOT DETECTED. The SARS-CoV-2 RNA is generally detectable in upper and lower respiratory specimens during the acute phase of infection. Negative results do not preclude SARS-CoV-2 infection, do  not rule out co-infections with other pathogens, and should not be used as the sole basis for treatment or other patient management decisions. Negative results must be combined with clinical observations, patient history, and epidemiological information. The expected result is Negative. Fact Sheet for Patients: 04/10/20 Fact Sheet for Healthcare Providers: HairSlick.no This test is not yet approved or cleared by the quierodirigir.com FDA and  has been authorized for detection and/or diagnosis of SARS-CoV-2 by FDA under an Emergency Use Authorization (EUA). This EUA will remain  in effect (meaning this test can be used) for the duration of the COVID-19 declaration under Section 56 4(b)(1) of the Act, 21 U.S.C. section 360bbb-3(b)(1), unless the authorization is terminated or revoked sooner. Performed at [facility], [address] [place], [place]   MRSA PCR Screening     Status: None   Collection Time: 04/08/20  6:01 PM   Specimen: Nasal Mucosa; Nasopharyngeal  Result Value Ref Range Status   MRSA by PCR NEGATIVE NEGATIVE Final    Comment:        The GeneXpert MRSA  Assay (FDA approved for NASAL specimens only), is one component of a comprehensive MRSA colonization surveillance program. It is not intended to diagnose MRSA infection nor to guide or monitor treatment for MRSA infections. Performed at [facility], [address] [place], [place] [address]          Radiology Studies: [facility] Chest Port 1 View  Result Date: 04/08/2020 CLINICAL DATA:  Elevated glucose EXAM: PORTABLE CHEST 1 VIEW COMPARISON:  None. FINDINGS: Cardiac shadows within normal limits. Aortic calcifications are noted. The lungs are well aerated bilaterally. Right medial basilar consolidation is noted with mild volume loss and mediastinal shift to the right. No bony abnormality is noted. IMPRESSION: Consolidation  in the right medial lung base consistent with acute infiltrate. Electronically Signed   By: Alcide Clever M.D.   On: 04/08/2020 16:16        Scheduled Meds: . aspirin EC  81 mg Oral Daily  . atorvastatin  80 mg Oral QHS  . azithromycin  500 mg Oral Daily  . Chlorhexidine Gluconate Cloth  6 each Topical Daily  . enoxaparin (LOVENOX) injection  40 mg Subcutaneous Q24H  . ferrous sulfate  325 mg Oral Q breakfast  . gabapentin  300 mg Oral Daily  . insulin glargine  20 Units Subcutaneous Daily  . polyethylene glycol  17 g Oral Daily   Continuous Infusions: . cefTRIAXone (ROCEPHIN)  IV Stopped (04/08/20 1900)     LOS: 0 days    Time spent:    Zannie Cove, MD [facility]   04/09/2020, 11:52 AM

## 2020-04-09 NOTE — Evaluation (Signed)
Clinical/Bedside Swallow Evaluation Patient Details  Name: Ethan Diaz MRN: [id number] Date of Birth: 12/24/1956  Today's Date: 04/09/2020 Time: SLP Start Time (ACUTE ONLY): 1249 SLP Stop Time (ACUTE ONLY): 1311 SLP Time Calculation (min) (ACUTE ONLY): 22 min  Past Medical History:  Past Medical History:  Diagnosis Date  . CHF (congestive heart failure) (HCC)   . Coronary artery disease   . Diabetes mellitus without complication (HCC)   . Hemiparesis (HCC)   . MI, old   . Pancreatitis   . Stroke [facility])    Past Surgical History: History reviewed. No pertinent surgical history. HPI:  64yo male admitted from ALF 04/08/20 with high blood sugar. PMH: DM2, peripheral neuropathy, R CVA with left hemiplegia, CHF, CAD, MI, pancreatitis. CXR = Consolidation in the right medial lung base consistent with acute infiltrate.   Assessment / Plan / Recommendation Clinical Impression  Pt seen at bedside for clinical swallow evaluation. Pt reports no history of dysphagia, and presents with no high risk indicators for silent aspiration. Pt has upper and lower dentures, mild left facial weakness, residual from CVA in the 1990s. Speech is fully intelligible. No overt s/s aspiration observed or reported. Will continue current diet. SLP will follow briefly for education. Recommend consideration of instrumental study if lungs do not clear in a timely fashion, or if PNA becomes recurrent. RN and MD informed.  SLP Visit Diagnosis: Dysphagia, unspecified (R13.10)    Aspiration Risk  Mild aspiration risk    Diet Recommendation Regular;Thin liquid   Liquid Administration via: Cup;Straw Medication Administration: Whole meds with liquid Supervision: Patient able to self feed Compensations: Minimize environmental distractions;Slow rate;Small sips/bites Postural Changes: Seated upright at 90 degrees    Other  Recommendations Oral Care Recommendations: Oral care BID   Follow up Recommendations None       Frequency and Duration min 1 x/week  1 week       Prognosis Prognosis for Safe Diet Advancement: Good      Swallow Study   General Date of Onset: 04/08/20 HPI: 64yo male admitted from ALF 04/08/20 with high blood sugar. PMH: DM2, peripheral neuropathy, R CVA with left hemiplegia, CHF, CAD, MI, pancreatitis. CXR = Consolidation in the right medial lung base consistent with acute infiltrate. Type of Study: Bedside Swallow Evaluation Previous Swallow Assessment: none Diet Prior to this Study: Regular;Thin liquids Temperature Spikes Noted: No Respiratory Status: Room air History of Recent Intubation: No Behavior/Cognition: Alert;Cooperative;Pleasant mood Oral Cavity Assessment: Within Functional Limits Oral Care Completed by SLP: No Oral Cavity - Dentition: Dentures, top;Dentures, bottom Vision: Functional for self-feeding Self-Feeding Abilities: Able to feed self Patient Positioning: Upright in bed Baseline Vocal Quality: Normal Volitional Cough: Strong Volitional Swallow: Able to elicit    Oral/Motor/Sensory Function Overall Oral Motor/Sensory Function: Mild impairment Facial ROM: Reduced left Facial Symmetry: Abnormal symmetry left Facial Strength: Reduced left Facial Sensation: Within Functional Limits Lingual ROM: Within Functional Limits Lingual Symmetry: Within Functional Limits Lingual Strength: Within Functional Limits Lingual Sensation: Within Functional Limits Mandible: Within Functional Limits   Ice Chips Ice chips: Not tested   Thin Liquid Thin Liquid: Within functional limits Presentation: Straw    Nectar Thick Nectar Thick Liquid: Not tested   Honey Thick Honey Thick Liquid: Not tested   Puree Puree: Not tested Other Comments: pt declined this texture   Solid     Solid: Within functional limits Presentation: Self Fed     Kelly Ranieri B. Murvin Natal, [facility], CCC-SLP Speech Language Pathologist Office: [phone] Pager: [phone]  [place],  Fredirick Maudlin 04/09/2020,1:14 PM

## 2020-04-09 NOTE — Progress Notes (Signed)
Patient expressed to the RN that he is missing his billfold and that is had $100 cash and debit cards in it. He then stated a few minutes later that there is $3000 cash in it. RN called ICU to clarify about item and Lanora Manis, his nurse for today, did not see a billfold when packing up his belongings for transfer. Security/lost and found was called to inquire about billfold and they did not see anything. AC contacted for next steps. Family member Charlotte Sanes called to inquire and inform about the situation. She states it may be at home and that he is forgetful at times. At this time we will continue to look out for the missing item.

## 2020-04-10 LAB — CBC
HCT: 41.8 % (ref 39.0–52.0)
Hemoglobin: 13.9 g/dL (ref 13.0–17.0)
MCH: 31.4 pg (ref 26.0–34.0)
MCHC: 33.3 g/dL (ref 30.0–36.0)
MCV: 94.6 fL (ref 80.0–100.0)
Platelets: 462 10*3/uL — ABNORMAL HIGH (ref 150–400)
RBC: 4.42 MIL/uL (ref 4.22–5.81)
RDW: 13.5 % (ref 11.5–15.5)
WBC: 11.9 10*3/uL — ABNORMAL HIGH (ref 4.0–10.5)
nRBC: 0 % (ref 0.0–0.2)

## 2020-04-10 LAB — BASIC METABOLIC PANEL
Anion gap: 10 (ref 5–15)
BUN: 18 mg/dL (ref 8–23)
CO2: 21 mmol/L — ABNORMAL LOW (ref 22–32)
Calcium: 9 mg/dL (ref 8.9–10.3)
Chloride: 111 mmol/L (ref 98–111)
Creatinine, Ser: 1.23 mg/dL (ref 0.61–1.24)
GFR calc Af Amer: >60 mL/min (ref >60)
GFR calc non Af Amer: >60 mL/min (ref >60)
Glucose, Bld: 86 mg/dL (ref 70–99)
Potassium: 4 mmol/L (ref 3.5–5.1)
Sodium: 142 mmol/L (ref 135–145)

## 2020-04-10 LAB — GLUCOSE, CAPILLARY
Glucose-Capillary: 83 mg/dL (ref 70–99)
Glucose-Capillary: 113 mg/dL — ABNORMAL HIGH (ref 70–99)
Glucose-Capillary: 143 mg/dL — ABNORMAL HIGH (ref 70–99)
Glucose-Capillary: 110 mg/dL — ABNORMAL HIGH (ref 70–99)

## 2020-04-10 MED ORDER — INSULIN ASPART PROT & ASPART (70-30 MIX) 100 UNIT/ML ~~LOC~~ SUSP: 15.0000 [IU] | Freq: Two times a day (BID) | SUBCUTANEOUS | 11 refills | Status: AC

## 2020-04-10 MED ORDER — CEFDINIR 300 MG PO CAPS: 300.0000 mg | ORAL_CAPSULE | Freq: Two times a day (BID) | ORAL | 0 refills | Status: AC

## 2020-04-10 NOTE — NC FL2 (Addendum)
[place] MEDICAID FL2 LEVEL OF CARE SCREENING TOOL     IDENTIFICATION  Patient Name: Ethan Diaz Birthdate: [birth year] Sex: male Admission Date (Current Location): 04/08/2020  [facility] and [place] Number:  Producer, television/film/video and Address:  [facility],  [address] [place] [address]      Provider Number: [id number]  Attending Physician Name and Address:  Zannie Cove, MD  Relative Name and Phone Number:       Current Level of Care:   Recommended Level of Care: Assisted Living Facility Prior Approval Number:    Date Approved/Denied:   PASRR Number:    Discharge Plan: Other (Comment)(Assisted Living Facility)    Current Diagnoses: Patient Active Problem List   Diagnosis Date Noted  . Hyperglycemia 04/08/2020  . 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    Orientation RESPIRATION BLADDER Height & Weight     Self, Time, Situation, Place  Normal Continent Weight: 146 lb (66.2 kg) Height:  5\' 6"  (167.6 cm)  BEHAVIORAL SYMPTOMS/MOOD NEUROLOGICAL BOWEL NUTRITION STATUS      Continent Diet(Carb Modified)  AMBULATORY STATUS COMMUNICATION OF NEEDS Skin   Limited Assist Verbally Normal                       Personal Care Assistance Level of Assistance  Bathing, Feeding, Dressing Bathing Assistance: Limited assistance Feeding assistance: Independent Dressing Assistance: Limited assistance     Functional Limitations Info  Sight, Speech, Hearing Sight Info: Adequate Hearing Info: Adequate Speech Info: Adequate    SPECIAL CARE FACTORS FREQUENCY                       Contractures Contractures Info: Not present    Additional Factors Info  Code Status, Allergies, Insulin Sliding Scale Code Status Info: Fullcode Allergies Info: Allergies: No Known Allergies   Insulin Sliding Scale Info: See MAR       Current Medications (04/10/2020):  This is the current hospital active medication list Current Facility-Administered Medications   Medication Dose Route Frequency Provider Last Rate Last Admin  . acetaminophen (TYLENOL) tablet 500 mg  500 mg Oral Q6H PRN 04/12/2020, MD      . aspirin EC tablet 81 mg  81 mg Oral Daily Zannie Cove, MD   81 mg at 04/10/20 1007  . atorvastatin (LIPITOR) tablet 80 mg  80 mg Oral QHS 04/12/20, MD   80 mg at 04/09/20 2109  . azithromycin (ZITHROMAX) tablet 500 mg  500 mg Oral Daily 2110, MD   500 mg at 04/10/20 1007  . cefTRIAXone (ROCEPHIN) 1 g in sodium chloride 0.9 % 100 mL IVPB  1 g Intravenous Q24H 04/12/20, MD 200 mL/hr at 04/09/20 2308 1 g at 04/09/20 2308  . Chlorhexidine Gluconate Cloth 2 % PADS 6 each  6 each Topical Daily 2309, MD   6 each at 04/09/20 [phone]  . dextrose 50 % solution 0-50 mL  0-50 mL Intravenous PRN 12-07-1974, MD      . enoxaparin (LOVENOX) injection 40 mg  40 mg Subcutaneous Q24H Zannie Cove, MD   40 mg at 04/09/20 2110  . ferrous sulfate tablet 325 mg  325 mg Oral Q breakfast 2111, MD   325 mg at 04/10/20 1006  . gabapentin (NEURONTIN) capsule 300 mg  300 mg Oral Daily 04/12/20, MD   300 mg at 04/10/20 1007  . insulin aspart (novoLOG) injection 0-9 Units  0-9 Units Subcutaneous TID  WC Domenic Polite, MD   2 Units at 04/09/20 1310  . insulin aspart (novoLOG) injection 4 Units  4 Units Subcutaneous TID WC Domenic Polite, MD   Stopped at 04/10/20 [phone]  . insulin glargine (LANTUS) injection 20 Units  20 Units Subcutaneous Daily Domenic Polite, MD   20 Units at 04/10/20 1008  . ondansetron (ZOFRAN) tablet 4 mg  4 mg Oral Q6H PRN Domenic Polite, MD       Or  . ondansetron [facility]) injection 4 mg  4 mg Intravenous Q6H PRN Domenic Polite, MD      . polyethylene glycol (MIRALAX / GLYCOLAX) packet 17 g  17 g Oral Daily Domenic Polite, MD   17 g at 04/10/20 1010     Discharge Medications: Please see discharge summary for a list of discharge medications.    Medication List    STOP taking these  medications   cyclobenzaprine 10 MG tablet Commonly known as: FLEXERIL   HYDROcodone-acetaminophen 5-325 MG tablet Commonly known as: NORCO/VICODIN   losartan 50 MG tablet Commonly known as: COZAAR     TAKE these medications   acetaminophen 500 MG tablet Commonly known as: TYLENOL Take 500 mg by mouth at bedtime.   aspirin EC 81 MG tablet Take 81 mg by mouth daily.   atorvastatin 80 MG tablet Commonly known as: LIPITOR Take 80 mg by mouth at bedtime.   cefdinir 300 MG capsule Commonly known as: OMNICEF Take 1 capsule (300 mg total) by mouth 2 (two) times daily for 3 days.   CENTRUM SILVER 50+MEN PO Take 1 tablet by mouth daily.   cholecalciferol 25 MCG (1000 UNIT) tablet Commonly known as: VITAMIN D3 Take 1,000 Units by mouth daily.   docusate sodium 100 MG capsule Commonly known as: COLACE Take 100 mg by mouth daily.   ferrous sulfate 325 (65 FE) MG tablet Take 325 mg by mouth daily with breakfast.   furosemide 20 MG tablet Commonly known as: LASIX Take 20 mg by mouth every other day.   gabapentin 300 MG capsule Commonly known as: NEURONTIN Take 300 mg by mouth daily.   Glucerna Liqd Take 237 mLs by mouth daily.   insulin aspart protamine- aspart (70-30) 100 UNIT/ML injection Commonly known as: NOVOLOG MIX 70/30 Inject 0.15 mLs (15 Units total) into the skin 2 (two) times daily with a meal. Take 15units in am and 15 units in pm What changed:   how much to take  when to take this  additional instructions   Jardiance 25 MG Tabs tablet Generic drug: empagliflozin Take 12.5 mg by mouth daily.   lidocaine 4 % cream Commonly known as: LMX Apply 1 application topically as needed (toe).   polyethylene glycol 17 g packet Commonly known as: MIRALAX / GLYCOLAX Take 17 g by mouth daily.   PREPARATION H EX Apply 1 application topically every 8 (eight) hours as needed (hemorrhoids).      No Known Allergies   Relevant Imaging  Results:  Relevant Lab Results:   Additional Information SSN: [id number]  Lia Hopping, LCSW

## 2020-04-10 NOTE — TOC Transition Note (Signed)
Transition of Care [facility]) - CM/SW Discharge Note   Patient Details  Name: Brittney Caraway MRN: [id number] Date of Birth: 19-Sep-1956  [facility] CM/SW Contact:  Clearance Coots, LCSW Phone Number: 04/10/2020, 3:37 PM   Clinical Narrative:    [facility] [place] location ready to accept the patient. Nurse gave report to MIA.  D/C Summary and signed FL2 faxed via HUB. Daughter Charlotte Sanes Notified.   Final next level of care: Assisted Living Barriers to Discharge: Barriers Resolved   Patient Goals and CMS Choice Patient states their goals for this hospitalization and ongoing recovery are:: Return to facility   Choice offered to / list presented to : NA  Discharge Placement              Patient chooses bed at: [facility]([place] LOCATION) Patient to be transferred to facility by: [facility] Name of family member notified: Lanora Manis Patient and family notified of of transfer: 04/10/20  Discharge Plan and Services In-house Referral: NA Discharge Planning Services: NA Post Acute Care Choice: NA          DME Arranged: N/A DME Agency: NA       HH Arranged: NA HH Agency: NA        Social Determinants of Health (SDOH) Interventions     Readmission Risk Interventions No flowsheet data found.

## 2020-04-10 NOTE — Progress Notes (Signed)
Discharge instructions and report were given to facility. All questions were answered. Patient was transported to facility by [facility]. ? ?

## 2020-04-10 NOTE — Progress Notes (Signed)
MD made aware of morning CBG of 83. Inquired about holding scheduled novolog.

## 2020-04-10 NOTE — Progress Notes (Signed)
Pt transported off the floor via [facility] at 2210.

## 2020-04-10 NOTE — Discharge Summary (Signed)
Physician Discharge Summary  Ethan Diaz IOX:[id number] DOB: 12-Mar-1956 DOA: 04/08/2020  PCP: [facility]  Admit date: 04/08/2020 Discharge date: 04/10/2020  Time spent: 35 minutes  Recommendations for Outpatient Follow-up:  1. PCP in 1 week, monitor CBGs and titrate insulin 70/30 as needed   Discharge Diagnoses:  Hyperosmolar nonketotic hyperglycemia Uncontrolled type 2 diabetes mellitus with hyperglycemia Peripheral neuropathy History of CVA with left hemiplegia History of CHF Community-acquired pneumonia   Discharge Condition: Improved  Diet recommendation: Low-sodium, diabetic  Filed Weights   04/08/20 1628  Weight: 66.2 kg    History of present illness:  Ethan Diaz a 64 y.o.malewith medical history significantfor type 2 diabetes mellitus, peripheral neuropathy recently started on insulin, history of CVA with left hemiplegia, history of CHF, resident of assisted living facility was sent to the emergency room as a result of high blood sugars. -he reported  that his sugars were been running high for the past couple of weeks, he was started on insulin approximately a month ago by his report, unable to tell me what type of insulin or how much he uses. ED Course:CBGs were greater than 600, edema noted normal sodium, CO2 of 19, blood glucose of 614 with normal anion gap, white count of 10.7, ABG with pH of 7.32 -He was started on IV fluids and insulin drip in the ED and CXR noted RLL pneumonia    Hospital Course:   Hyperosmolar nonketotic hyperglycemia Uncontrolled type 2 diabetes mellitus Dehydration from osmotic diuresis -Unclear trigger, admitted with blood glucose of 614 with dehydration,  it appears that his sugars have been running high for several weeks, patient reports that he was started on insulin not too long ago -Treated with IV fluids and insulin drip -Hemoglobin A1c is 10 -He was transitioned back to long-acting insulin, started on insulin  70/30 not too long ago at his facility, he was taking 8 and 10 units of this, dose increased to 15 units twice daily  Community-acquired pneumonia versus aspiration -Clinically improved with antibiotics, treated with ceftriaxone and azithromycin -SLP evaluation completed, noted to have mild aspiration risk, recommended regular diet with thin liquids, aspiration precautions -Switch to oral cefdinir at discharge  History of CVA Left hemiplegia -continueaspirin and statin  History of CHF -Appears euvolemic, Lasix on hold  Hypertension -Blood pressure is soft but stable, losartan on hold  Small foot ulcer -Recently seen by podiatrist, clean-based ulcer without signs or symptoms of infection, follow-up with podiatrist   Discharge Exam: Vitals:   04/10/20 0637 04/10/20 1333  BP: (!) 86/61 117/86  Pulse: 94 (!) 103  Resp: 17 17  Temp: 98.1 F (36.7 C) 97.8 F (36.6 C)  SpO2: 93% 96%    General: AAOx3 Cardiovascular: S1S2/RRR Respiratory: CTAB  Discharge Instructions   Discharge Instructions    Diet - low sodium heart healthy   Complete by: As directed    Diet Carb Modified   Complete by: As directed    Increase activity slowly   Complete by: As directed      Allergies as of 04/10/2020   No Known Allergies     Medication List    STOP taking these medications   cyclobenzaprine 10 MG tablet Commonly known as: FLEXERIL   HYDROcodone-acetaminophen 5-325 MG tablet Commonly known as: NORCO/VICODIN   losartan 50 MG tablet Commonly known as: COZAAR     TAKE these medications   acetaminophen 500 MG tablet Commonly known as: TYLENOL Take 500 mg by mouth at bedtime.   aspirin  EC 81 MG tablet Take 81 mg by mouth daily.   atorvastatin 80 MG tablet Commonly known as: LIPITOR Take 80 mg by mouth at bedtime.   cefdinir 300 MG capsule Commonly known as: OMNICEF Take 1 capsule (300 mg total) by mouth 2 (two) times daily for 3 days.   CENTRUM SILVER 50+MEN  PO Take 1 tablet by mouth daily.   cholecalciferol 25 MCG (1000 UNIT) tablet Commonly known as: VITAMIN D3 Take 1,000 Units by mouth daily.   docusate sodium 100 MG capsule Commonly known as: COLACE Take 100 mg by mouth daily.   ferrous sulfate 325 (65 FE) MG tablet Take 325 mg by mouth daily with breakfast.   furosemide 20 MG tablet Commonly known as: LASIX Take 20 mg by mouth every other day.   gabapentin 300 MG capsule Commonly known as: NEURONTIN Take 300 mg by mouth daily.   Glucerna Liqd Take 237 mLs by mouth daily.   insulin aspart protamine- aspart (70-30) 100 UNIT/ML injection Commonly known as: NOVOLOG MIX 70/30 Inject 0.15 mLs (15 Units total) into the skin 2 (two) times daily with a meal. Take 15units in am and 15 units in pm What changed:   how much to take  when to take this  additional instructions   Jardiance 25 MG Tabs tablet Generic drug: empagliflozin Take 12.5 mg by mouth daily.   lidocaine 4 % cream Commonly known as: LMX Apply 1 application topically as needed (toe).   polyethylene glycol 17 g packet Commonly known as: MIRALAX / GLYCOLAX Take 17 g by mouth daily.   PREPARATION H EX Apply 1 application topically every 8 (eight) hours as needed (hemorrhoids).      No Known Allergies Contact information for after-discharge care    Destination    [facility] .   Service: Assisted Living Contact information: [address] [place] [address] [phone]               The results of significant diagnostics from this hospitalization (including imaging, microbiology, ancillary and laboratory) are listed below for reference.    Significant Diagnostic Studies: DG Chest Port 1 View  Result Date: 04/08/2020 CLINICAL DATA:  Elevated glucose EXAM: PORTABLE CHEST 1 VIEW COMPARISON:  None. FINDINGS: Cardiac shadows within normal limits. Aortic calcifications are noted. The lungs are well aerated  bilaterally. Right medial basilar consolidation is noted with mild volume loss and mediastinal shift to the right. No bony abnormality is noted. IMPRESSION: Consolidation in the right medial lung base consistent with acute infiltrate. Electronically Signed   By: Alcide Clever M.D.   On: 04/08/2020 16:16    Microbiology: Recent Results (from the past 240 hour(s))  SARS CORONAVIRUS 2 (TAT 6-24 HRS) Nasopharyngeal Nasopharyngeal Swab     Status: None   Collection Time: 04/08/20  4:31 PM   Specimen: Nasopharyngeal Swab  Result Value Ref Range Status   SARS Coronavirus 2 NEGATIVE NEGATIVE Final    Comment: (NOTE) SARS-CoV-2 target nucleic acids are NOT DETECTED. The SARS-CoV-2 RNA is generally detectable in upper and lower respiratory specimens during the acute phase of infection. Negative results do not preclude SARS-CoV-2 infection, do not rule out co-infections with other pathogens, and should not be used as the sole basis for treatment or other patient management decisions. Negative results must be combined with clinical observations, patient history, and epidemiological information. The expected result is Negative. Fact Sheet for Patients: HairSlick.no Fact Sheet for Healthcare Providers: quierodirigir.com This test is not  yet approved or cleared by the [place] and  has been authorized for detection and/or diagnosis of SARS-CoV-2 by FDA under an Emergency Use Authorization (EUA). This EUA will remain  in effect (meaning this test can be used) for the duration of the COVID-19 declaration under Section 56 4(b)(1) of the Act, 21 U.S.C. section 360bbb-3(b)(1), unless the authorization is terminated or revoked sooner. Performed at [facility], [place] [address] [place], [place] [address]   MRSA PCR Screening     Status: None   Collection Time: 04/08/20  6:01 PM   Specimen: Nasal Mucosa; Nasopharyngeal  Result Value Ref  Range Status   MRSA by PCR NEGATIVE NEGATIVE Final    Comment:        The GeneXpert MRSA Assay (FDA approved for NASAL specimens only), is one component of a comprehensive MRSA colonization surveillance program. It is not intended to diagnose MRSA infection nor to guide or monitor treatment for MRSA infections. Performed at [facility], [place] [address], [place], [place] [address]      Labs: Basic Metabolic Panel: Recent Labs  Lab 04/08/20 1454 04/09/20 0112 04/10/20 0750  NA 136 142 142  K 4.7 3.9 4.0  CL 107 113* 111  CO2 19* 19* 21*  GLUCOSE 614* 137* 86  BUN 34* 24* 18  CREATININE 1.47* 1.30* 1.23  CALCIUM 7.9* 8.5* 9.0   Liver Function Tests: Recent Labs  Lab 04/08/20 1454 04/09/20 0112  AST 23 33  ALT 31 33  ALKPHOS 87 82  BILITOT 0.6 0.7  PROT 5.7* 5.9*  ALBUMIN 2.9* 2.8*   No results for input(s): LIPASE, AMYLASE in the last 168 hours. No results for input(s): AMMONIA in the last 168 hours. CBC: Recent Labs  Lab 04/08/20 1454 04/09/20 0112 04/10/20 0750  WBC 10.7* 13.6* 11.9*  NEUTROABS 7.3  --   --   HGB 11.8* 12.7* 13.9  HCT 36.6* 38.4* 41.8  MCV 94.8 96.5 94.6  PLT 473* 417* 462*   Cardiac Enzymes: No results for input(s): CKTOTAL, CKMB, CKMBINDEX, TROPONINI in the last 168 hours. BNP: BNP (last 3 results) No results for input(s): BNP in the last 8760 hours.  ProBNP (last 3 results) No results for input(s): PROBNP in the last 8760 hours.  CBG: Recent Labs  Lab 04/09/20 1620 04/09/20 2228 04/10/20 0732 04/10/20 1152 04/10/20 1357  GLUCAP 95 87 83 113* 143*   Signed:  Domenic Polite MD.  [facility] 04/10/2020, 2:14 PM
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
## 2020-04-10 NOTE — TOC Initial Note (Addendum)
Transition of Care [facility]) - Initial/Assessment Note    Patient Details  Name: Ethan Diaz MRN: [id number] Date of Birth: [birth year]  Transition of Care [facility]) CM/SW Contact:    Lia Hopping, [place] Phone Number: 04/10/2020, 12:06 PM  Clinical Narrative:                 Patient admitted from [facility].  CSW reached out ALF and spoke to resident coordinator Nyra Jabs to discuss the patient return. She reports at the facility the patient needs assistance with transferring in the shower. The patient is independent with toileting and dressing self.  The patient uses a power wheelchair and a quad cane to transfer. The patient is on a carb modified diet. Hailey reports the facility has had trouble with getting the [facility] to [facility] the patient medications in a timely manner. Patient will have to pay privately for any new medications until the facility can arrange for the [facility] to cover the cost.   CSW met with the patient at beside. Patient alert and oriented x4. Patient express his readiness to discharge. CSW explain role in assisting with the discharge process. Patient report understanding. CSW explain the facility request for the patient to pay for his medications. Patient agreeable to pay for them until [place] covers the cost.   FL2 completed, sent via [place]: Return to [facility]   Expected Discharge Plan: Assisted Living Barriers to Discharge: Barriers Resolved   Patient Goals and CMS Choice Patient states their goals for this hospitalization and ongoing recovery are:: Return to facility   Choice offered to / list presented to : NA  Expected Discharge Plan and Services Expected Discharge Plan: Assisted Living In-house Referral: NA Discharge Planning Services: NA Post Acute Care Choice: NA Living arrangements for the past 2 months: [place]                 DME Arranged: N/A DME Agency: NA       HH Arranged: NA [place] Agency: NA         Prior Living Arrangements/Services Living arrangements for the past 2 months: [place] Lives with:: Facility Resident Patient language and need for interpreter reviewed:: No Do you feel safe going back to the place where you live?: Yes      Need for Family Participation in Patient Care: Yes (Comment) Care giver support system in place?: Yes (comment) Current home services: DME Criminal Activity/Legal Involvement Pertinent to Current Situation/Hospitalization: No - Comment as needed  Activities of Daily Living Home Assistive Devices/Equipment: Hearing aid, Eyeglasses, Other (Comment), Cane (specify quad or straight)(bilateral hearing aides, electric scooter) ADL Screening (condition at time of admission) Patient's cognitive ability adequate to safely complete daily activities?: Yes Is the patient deaf or have difficulty hearing?: Yes(wears bilateral hearing aides) Does the patient have difficulty seeing, even when wearing glasses/contacts?: No Does the patient have difficulty concentrating, remembering, or making decisions?: No Patient able to express need for assistance with ADLs?: Yes Does the patient have difficulty dressing or bathing?: No Independently performs ADLs?: Yes (appropriate for developmental age) Does the patient have difficulty walking or climbing stairs?: Yes Weakness of Legs: None Weakness of Arms/Hands: None  Permission Sought/Granted Permission sought to share information with : Case Manager Permission granted to share information with : Yes, Verbal Permission Granted  Share Information with NAME: Dickie La  Permission granted to share info w AGENCY: [place] granted to share info w  Relationship: Daughter  Permission granted to share info w Contact Information: [phone]  Emotional Assessment Appearance:: Appears stated age Attitude/Demeanor/Rapport: Engaged Affect (typically observed): Accepting,  Calm Orientation: : Oriented to Self, Oriented to Place, Oriented to  Time, Oriented to Situation Alcohol / Substance Use: Not Applicable Psych Involvement: No (comment)  Admission diagnosis:  Hyperglycemia [R73.9] 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 Hyperglycemia 04/08/2020  . 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

## 2020-04-28 ENCOUNTER — Emergency Department (HOSPITAL_COMMUNITY)
Admission: EM | Admit: 2020-04-28 | Discharge: 2020-04-29 | Disposition: A | Discharge status: Skilled Nursing Facility | Payer: No Typology Code available for payment source | Attending: Emergency Medicine | Admitting: Emergency Medicine

## 2020-04-28 DIAGNOSIS — Z79899 Other long term (current) drug therapy: Secondary | ICD-10-CM | POA: Insufficient documentation

## 2020-04-28 DIAGNOSIS — E1140 Type 2 diabetes mellitus with diabetic neuropathy, unspecified: Secondary | ICD-10-CM | POA: Diagnosis not present

## 2020-04-28 DIAGNOSIS — Z7982 Long term (current) use of aspirin: Secondary | ICD-10-CM | POA: Diagnosis not present

## 2020-04-28 DIAGNOSIS — I252 Old myocardial infarction: Secondary | ICD-10-CM | POA: Diagnosis not present

## 2020-04-28 DIAGNOSIS — L97519 Non-pressure chronic ulcer of other part of right foot with unspecified severity: Secondary | ICD-10-CM | POA: Insufficient documentation

## 2020-04-28 DIAGNOSIS — I2510 Atherosclerotic heart disease of native coronary artery without angina pectoris: Secondary | ICD-10-CM | POA: Diagnosis not present

## 2020-04-28 DIAGNOSIS — L97529 Non-pressure chronic ulcer of other part of left foot with unspecified severity: Secondary | ICD-10-CM | POA: Insufficient documentation

## 2020-04-28 DIAGNOSIS — I509 Heart failure, unspecified: Secondary | ICD-10-CM | POA: Diagnosis not present

## 2020-04-28 DIAGNOSIS — Z20822 Contact with and (suspected) exposure to covid-19: Secondary | ICD-10-CM | POA: Diagnosis not present

## 2020-04-28 DIAGNOSIS — Z87891 Personal history of nicotine dependence: Secondary | ICD-10-CM | POA: Diagnosis not present

## 2020-04-28 DIAGNOSIS — Z794 Long term (current) use of insulin: Secondary | ICD-10-CM | POA: Insufficient documentation

## 2020-04-28 DIAGNOSIS — E11621 Type 2 diabetes mellitus with foot ulcer: Secondary | ICD-10-CM | POA: Insufficient documentation

## 2020-04-28 DIAGNOSIS — Z8673 Personal history of transient ischemic attack (TIA), and cerebral infarction without residual deficits: Secondary | ICD-10-CM | POA: Diagnosis not present

## 2020-04-28 DIAGNOSIS — Z5189 Encounter for other specified aftercare: Secondary | ICD-10-CM

## 2020-04-28 LAB — CBG MONITORING, ED: Glucose-Capillary: 139 mg/dL — ABNORMAL HIGH (ref 70–99)

## 2020-04-28 NOTE — Progress Notes (Signed)
TOC CM spoke to [facility] ALF Director, [place] [phone], states pt exceeds their level of care at the ALF with the wounds. They have been seeking SNF placement. Will send message to ED provider for PT evaluation and wound care consult. Isidoro Donning RN CCM, [facility] ED TOC CM [phone]

## 2020-04-28 NOTE — ED Notes (Signed)
Spoke with International aid/development worker at [employer] at [place]. She reports patient needs long term SNF placement for wound care. SW consult requested.

## 2020-04-28 NOTE — Progress Notes (Addendum)
CSW received a call from pt's RN at [facility] on [address] at ph: [phone] who states pt has wounds and as such they can't be treated in house at [facility] and pt needs a higher level of care.  Facility states they are currently attempting to find SNF placement for the pt at this time.  CSW spoke to EDP would like guidance on if pt's wounds would be considered skilleable.  CSW spoke to Tammy at [facility] who states wounds would have to be at least stage II, III, IV and dressings would need to be changed twice a day to potentially qualify for SNF.  EDP states pt's wounds can be appropriate for x2 a day.   CSW called EDP/RN for clarity.  EDP feels an attempt for 24 hours for safety is appropriate.  CSW will continue to follow for D/C needs.  Dorothe Pea. Nikcole Eischeid  MSW, LCSW, LCAS, CSI Transitions of Care Clinical Social Worker Care Coordination Department Ph: [phone]

## 2020-04-28 NOTE — Discharge Instructions (Addendum)
The wounds to your feet appear to be healing well.  He appeared to be getting good wound care.  This needs to be continued.  You needs repeat evaluation if you develop pain, swelling, redness or drainage.  Go to [facility] now

## 2020-04-28 NOTE — Progress Notes (Addendum)
CSW spoke with pt and confirmed pt's plan and that he was agreeable to be discharged to a SNF for short-term rehab at discharge.  CSW provided active listening and validated pt's concerns that his daughter be updated by phone and that pt's daughter be agreeable.   CSW was provided with verbal permission to complete an FL-2 and send referrals out to SNF facilities via the hub per pt's request.  Pt has been living at [facility], prior to being admitted to [facility].  CSW spoke to pt's daughter who states she is agreeable to this now, and will speak to pt to see if he is as committed as he should be as he usually does not like change.  EDP agreeable to placing PT consult/COVID test.  CSW will continue to follow for D/C needs.  Dorothe Pea. Allea Kassner  MSW, LCSW, LCAS, CSI Transitions of Care Clinical Social Worker Care Coordination Department Ph: [phone]                Lanora Manis

## 2020-04-28 NOTE — ED Notes (Signed)
Pt to room 31. Pt alert and oriented. Pt calm, cooperative. Pt in bed.

## 2020-04-28 NOTE — Progress Notes (Signed)
CSW was able to seek and receive a PASRR # from [facility] MUST:   [id number]  CSW will continue to follow for D/C needs.  Dorothe Pea. Chao Blazejewski  MSW, LCSW, LCAS, CSI Transitions of Care Clinical Social Worker Care Coordination Department Ph: [phone]

## 2020-04-28 NOTE — Progress Notes (Signed)
CSW updated [place] admin at [facility] at ph: [phone] who voiced understanding the ED will make an attempt to place pt but if after 24 hours pt's placement is not likely pt will have to be sent back to [facility] to continue placement from the facility, as the facility has been doing, per [facility].  CSW will continue to follow for D/C needs.  Dorothe Pea. Lina Hitch  MSW, LCSW, LCAS, CSI Transitions of Care Clinical Social Worker Care Coordination Department Ph: [phone]

## 2020-04-28 NOTE — NC FL2 (Signed)
[place] MEDICAID FL2 LEVEL OF CARE SCREENING TOOL     IDENTIFICATION  Patient Name: Ethan Diaz Birthdate: 05-18-1956 Sex: male Admission Date (Current Location): 04/28/2020  [facility] and [place] Number:  Producer, television/film/video and Address:  [facility],  [address] [place] [address]      Provider Number: [id number]  Attending Physician Name and Address:  Raeford Razor, MD  Relative Name and Phone Number:       Current Level of Care: Hospital Recommended Level of Care: Skilled Nursing Facility Prior Approval Number:    Date Approved/Denied:   PASRR Number: [id number]  Discharge Plan: (ALF)    Current Diagnoses: Patient Active Problem List   Diagnosis Date Noted  . Hyperglycemia 04/08/2020  . 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    Orientation RESPIRATION BLADDER Height & Weight     Self, Time, Situation, Place  Normal Continent Weight:   Height:     BEHAVIORAL SYMPTOMS/MOOD NEUROLOGICAL BOWEL NUTRITION STATUS      Continent Diet(Carb-modified)  AMBULATORY STATUS COMMUNICATION OF NEEDS Skin   Extensive Assist Verbally Normal(Pt has bilateral wounds/ulcer on his feet likley from diabetes)                       Personal Care Assistance Level of Assistance  Bathing, Dressing Bathing Assistance: Limited assistance   Dressing Assistance: Limited assistance     Functional Limitations Info    Sight Info: Adequate Hearing Info: Adequate Speech Info: Adequate    SPECIAL CARE FACTORS FREQUENCY  PT (By licensed PT), OT (By licensed OT)     PT Frequency: 5 OT Frequency: 5            Contractures Contractures Info: Not present    Additional Factors Info  Code Status, Allergies Code Status Info: FULL CODE Allergies Info: No Known Allergies           Current Medications (04/28/2020):  This is the current hospital active medication list No current facility-administered medications for this encounter.   Current Outpatient  Medications  Medication Sig Dispense Refill  . acetaminophen (TYLENOL) 500 MG tablet Take 500 mg by mouth at bedtime.     Mar[place] Kitchen aspirin EC 81 MG tablet Take 81 mg by mouth daily.    Marland Kitchen atorvastatin (LIPITOR) 80 MG tablet Take 80 mg by mouth at bedtime.    . cholecalciferol (VITAMIN D3) 25 MCG (1000 UNIT) tablet Take 1,000 Units by mouth daily.    Mar[place] Kitchen docusate sodium (COLACE) 100 MG capsule Take 100 mg by mouth daily.    . empagliflozin (JARDIANCE) 25 MG TABS tablet Take 12.5 mg by mouth daily.    . ferrous sulfate 325 (65 FE) MG tablet Take 325 mg by mouth daily with breakfast.    . furosemide (LASIX) 20 MG tablet Take 20 mg by mouth every other day.    . gabapentin (NEURONTIN) 300 MG capsule Take 300 mg by mouth daily.    . Glucerna (GLUCERNA) LIQD Take 237 mLs by mouth daily.    . insulin aspart protamine- aspart (NOVOLOG MIX 70/30) (70-30) 100 UNIT/ML injection Inject 0.15 mLs (15 Units total) into the skin 2 (two) times daily with a meal. Take 15units in am and 15 units in pm 10 mL 11  . lidocaine (LMX) 4 % cream Apply 1 application topically as needed (toe).    . Lidocaine-Glycerin (PREPARATION H EX) Apply 1 application topically every 8 (eight) hours as needed (hemorrhoids).    . Multiple Vitamins-Minerals (  CENTRUM SILVER 50+MEN PO) Take 1 tablet by mouth daily.    . polyethylene glycol (MIRALAX / GLYCOLAX) 17 g packet Take 17 g by mouth daily.       Discharge Medications: Please see discharge summary for a list of discharge medications.  Relevant Imaging Results:  Relevant Lab Results:   Additional Information Pt is type II diabetes and is on insulin [phone]  Alphonse Guild Hisao Doo, LCSW
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
## 2020-04-28 NOTE — Progress Notes (Addendum)
CSW received a call from the [facility] ED TOC RN CM stating pt was [facility] and pt has a wound possibly needing a higher-level of care (SNF).  Per the [facility] ED RN CM she spoke to [facility] staff who informed her of this.  CSW was asked to speak to the EDP in regards to pt's need for a higher level of care, due to pt's, "non-healing wound".   CSW will continue to follow for D/C needs.  Dorothe Pea. Joshawn Crissman  MSW, LCSW, LCAS, CSI Transitions of Care Clinical Social Worker Care Coordination Department Ph: [phone]

## 2020-04-28 NOTE — ED Provider Notes (Signed)
[facility] DEPT Provider Note   CSN: [id number] Arrival date & time: 04/28/20  1341     History Chief Complaint  Patient presents with  . foot ulcers    Ethan Diaz is a 64 y.o. male.  HPI   64 year old male presenting for wound evaluation.  Chronic wounds/ulcerations to bilateral feet.  Diabetic and history neuropathy.  Reports getting ongoing wound care.  He states that his home health nurse felt like his wounds been healing well but his facility wanted him evaluated.  He denies any acute pain.  No drainage.  No new swelling.  No fevers.  Past Medical History:  Diagnosis Date  . CHF (congestive heart failure) ([place])   . Coronary artery disease   . Diabetes mellitus without complication ([place])   . Hemiparesis ([place])   . MI, old   . Pancreatitis   . Stroke [facility])     Patient Active Problem List   Diagnosis Date Noted  . Hyperglycemia 04/08/2020  . 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     No family history on file.  Social History   Tobacco Use  . Smoking status: Former Research scientist (life sciences)  . Smokeless tobacco: Never Used  Substance Use Topics  . Alcohol use: Yes    Comment: 4oz red wine 2x/day  . Drug use: No    Home Medications Prior to Admission medications   Medication Sig Start Date End Date Taking? Authorizing Provider  acetaminophen (TYLENOL) 500 MG tablet Take 500 mg by mouth at bedtime.     [provider]  aspirin EC 81 MG tablet Take 81 mg by mouth daily.    [provider]  atorvastatin (LIPITOR) 80 MG tablet Take 80 mg by mouth at bedtime.    [provider]  cholecalciferol (VITAMIN D3) 25 MCG (1000 UNIT) tablet Take 1,000 Units by mouth daily.    [provider]  docusate sodium (COLACE) 100 MG capsule Take 100 mg by mouth daily.    [provider]  empagliflozin (JARDIANCE) 25 MG TABS tablet Take 12.5 mg by mouth daily.    [provider]    ferrous sulfate 325 (65 FE) MG tablet Take 325 mg by mouth daily with breakfast.    [provider]  furosemide (LASIX) 20 MG tablet Take 20 mg by mouth every other day.    [provider]  gabapentin (NEURONTIN) 300 MG capsule Take 300 mg by mouth daily.    [provider]  Glucerna (GLUCERNA) LIQD Take 237 mLs by mouth daily.    [provider]  insulin aspart protamine- aspart (NOVOLOG MIX 70/30) (70-30) 100 UNIT/ML injection Inject 0.15 mLs (15 Units total) into the skin 2 (two) times daily with a meal. Take 15units in am and 15 units in pm 04/10/20   Domenic Polite, MD  lidocaine (LMX) 4 % cream Apply 1 application topically as needed (toe).    [provider]  Lidocaine-Glycerin (PREPARATION H EX) Apply 1 application topically every 8 (eight) hours as needed (hemorrhoids).    [provider]  Multiple Vitamins-Minerals (CENTRUM SILVER 50+MEN PO) Take 1 tablet by mouth daily.    [provider]  polyethylene glycol (MIRALAX / GLYCOLAX) 17 g packet Take 17 g by mouth daily.    [provider]    Allergies    Patient has no known allergies.  Review of Systems   Review of Systems All systems reviewed and negative, other  than as noted in HPI.  Physical Exam Updated Vital Signs BP 100/69   Pulse 92   Temp 98.1 F (36.7 C) (Oral)   Resp 18   SpO2 98%   Physical Exam Vitals and nursing note reviewed.  Constitutional:      General: He is not in acute distress.    Appearance: He is well-developed.  HENT:     Head: Normocephalic and atraumatic.  Eyes:     General:        Right eye: No discharge.        Left eye: No discharge.     Conjunctiva/sclera: Conjunctivae normal.  Cardiovascular:     Rate and Rhythm: Normal rate and regular rhythm.     Heart sounds: Normal heart sounds. No murmur. No friction rub. No gallop.   Pulmonary:     Effort: Pulmonary effort is normal. No respiratory distress.     Breath  sounds: Normal breath sounds.  Abdominal:     General: There is no distension.     Palpations: Abdomen is soft.     Tenderness: There is no abdominal tenderness.  Musculoskeletal:        General: No tenderness.     Cervical back: Neck supple.     Comments: Nickel sized healing ulcerations to the medial aspect of first MTP joint both feet.  Both appear to be healing well.  No stomach tenderness.  No drainage.  No surrounding erythema.  No significant swelling.  Skin:    General: Skin is warm and dry.  Neurological:     Mental Status: He is alert.  Psychiatric:        Behavior: Behavior normal.        Thought Content: Thought content normal.     ED Results / Procedures / Treatments   Labs (all labs ordered are listed, but only abnormal results are displayed) Labs Reviewed - No data to display  EKG None  Radiology No results found.  Procedures Procedures (including critical care time)  Medications Ordered in ED Medications - No data to display  ED Course  I have reviewed the triage vital signs and the nursing notes.  Pertinent labs & imaging results that were available during my care of the patient were reviewed by me and considered in my medical decision making (see chart for details).    MDM Rules/Calculators/A&P                      64 year old male sent for wound evaluation.  Wounds on his feet appear to be healing without acute complication.  He seems to be getting good wound care.  This needs to be continued.  Return precautions were discussed.  No acute intervention needed at this time.  Final Clinical Impression(s) / ED Diagnoses Final diagnoses:  Visit for wound check    Rx / DC Orders ED Discharge Orders    None       Raeford Razor, MD 04/28/20 1535

## 2020-04-28 NOTE — Progress Notes (Signed)
CSW received a call from pt's daughter who asked tow questions her father had:  1.  Can I bring my scooter?  2. How are the pt's clothes normally brought from the ALF to the SNF for the pt's stay.  CSW provided pt's daughter with the number for the 1st shift [facility] ED RN CM for updated.  CSW will continue to follow for D/C needs.  Dorothe Pea. Shaelyn Decarli  MSW, LCSW, LCAS, CSI Transitions of Care Clinical Social Worker Care Coordination Department Ph: [phone]

## 2020-04-28 NOTE — TOC Initial Note (Signed)
Transition of Care [facility]) - Initial/Assessment Note    Patient Details  Name: Ethan Diaz MRN: [id number] Date of Birth: 1956-03-08  Transition of Care [facility]) CM/SW Contact:    Mercy Riding, LCSW Phone Number: 04/28/2020, 11:24 PM  Clinical Narrative:      CSW spoke with pt and confirmed pt's plan and that he was agreeable to be discharged to a SNF for short-term rehab at discharge.  CSW provided active listening and validated pt's concerns that his daughter be updated by phone and that pt's daughter be agreeable.   CSW was provided with verbal permission to complete an FL-2 and send referrals out to SNF facilities via the hub per pt's request. Pt has been living at [facility], prior to being admitted to [facility].  CSW spoke to pt's daughter who states she is agreeable to this now, and will speak to pt to see if he is as committed as he should be as he usually does not like change.             Expected Discharge Plan: Skilled Nursing Facility Barriers to Discharge: Insurance Authorization   Patient Goals and CMS Choice Patient states their goals for this hospitalization and ongoing recovery are:: To regain strength and return home      Expected Discharge Plan and Services Expected Discharge Plan: Skilled Nursing Facility       Living arrangements for the past 2 months: Assisted Living Facility                                      Prior Living Arrangements/Services Living arrangements for the past 2 months: Assisted Living Facility Lives with:: Facility Resident([facility])   Do you feel safe going back to the place where you live?: No      Need for Family Participation in Patient Care: Yes (Comment) Care giver support system in place?: Yes (comment)      Activities of Daily Living      Permission Sought/Granted Permission sought to share information with : Oceanographer granted to share information with : Yes, Verbal  Permission Granted              Emotional Assessment     Affect (typically observed): Calm, Appropriate, Accepting, Adaptable Orientation: : Oriented to Self, Oriented to Place, Oriented to  Time, Oriented to Situation   Psych Involvement: No (comment)  Admission diagnosis:  foot pain Patient Active Problem List   Diagnosis Date Noted  . Hyperglycemia 04/08/2020  . 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   2nd shift ED CSW will leave handoff for 1st shift ED [facility] RN CM.

## 2020-04-28 NOTE — Progress Notes (Signed)
Received Ethan Diaz at the change of shift sitting on the side of his bed in his room. He was assisted preparing him for bed per his request. He was OOB several times throughout the to the bathroom with assistance.

## 2020-04-28 NOTE — ED Triage Notes (Signed)
Pt brought in by [facility] for foot ulcers that have been going on since beginning of April on toes. CBG 205

## 2020-04-28 NOTE — Progress Notes (Signed)
Assessment/FL-2 (with new passr) completed and set out via hub to [place].  PT notes will have to be sent next once consult is completed and calls made to facilities, please.  CSW will continue to follow for D/C needs.  Dorothe Pea. Hudsyn Champine  MSW, LCSW, LCAS, CSI Transitions of Care Clinical Social Worker Care Coordination Department Ph: [phone]

## 2020-04-29 LAB — CBG MONITORING, ED
Glucose-Capillary: 122 mg/dL — ABNORMAL HIGH (ref 70–99)
Glucose-Capillary: 190 mg/dL — ABNORMAL HIGH (ref 70–99)
Glucose-Capillary: 94 mg/dL (ref 70–99)

## 2020-04-29 LAB — RESPIRATORY PANEL BY RT PCR (FLU A&B, COVID)
Influenza A by PCR: NEGATIVE
Influenza B by PCR: NEGATIVE
SARS Coronavirus 2 by RT PCR: NEGATIVE

## 2020-04-29 MED ORDER — DOCUSATE SODIUM 100 MG PO CAPS
100.0000 mg | ORAL_CAPSULE | Freq: Every day | ORAL | Status: DC
  Administered 2020-04-29: 09:00:00 100 mg via ORAL
  Filled 2020-04-29: qty 1

## 2020-04-29 MED ORDER — FERROUS SULFATE 325 (65 FE) MG PO TABS
325.0000 mg | ORAL_TABLET | Freq: Every day | ORAL | Status: DC
  Administered 2020-04-29: 325 mg via ORAL
  Filled 2020-04-29: qty 1

## 2020-04-29 MED ORDER — ATORVASTATIN CALCIUM 80 MG PO TABS
80.0000 mg | ORAL_TABLET | Freq: Every day | ORAL | Status: DC
  Filled 2020-04-29: qty 1

## 2020-04-29 MED ORDER — FUROSEMIDE 20 MG PO TABS: 20.0000 mg | ORAL_TABLET | ORAL | Status: DC

## 2020-04-29 MED ORDER — CANAGLIFLOZIN 100 MG PO TABS: 100.0000 mg | ORAL_TABLET | Freq: Every day | ORAL | Status: DC

## 2020-04-29 MED ORDER — GABAPENTIN 300 MG PO CAPS
300.0000 mg | ORAL_CAPSULE | Freq: Every day | ORAL | Status: DC
  Administered 2020-04-29: 300 mg via ORAL
  Filled 2020-04-29: qty 1

## 2020-04-29 MED ORDER — ASPIRIN EC 81 MG PO TBEC
81.0000 mg | DELAYED_RELEASE_TABLET | Freq: Every day | ORAL | Status: DC
  Administered 2020-04-29: 81 mg via ORAL
  Filled 2020-04-29: qty 1

## 2020-04-29 MED ORDER — INSULIN ASPART PROT & ASPART (70-30 MIX) 100 UNIT/ML ~~LOC~~ SUSP
15.0000 [IU] | Freq: Two times a day (BID) | SUBCUTANEOUS | Status: DC
  Administered 2020-04-29 (×2): 15 [IU] via SUBCUTANEOUS
  Filled 2020-04-29: qty 10

## 2020-04-29 MED ORDER — GLUCERNA 1.2 CAL PO LIQD
237.0000 mL | Freq: Every day | ORAL | Status: DC
  Administered 2020-04-29: 237 mL via ORAL
  Filled 2020-04-29: qty 237

## 2020-04-29 MED ORDER — VITAMIN D 25 MCG (1000 UNIT) PO TABS
1000.0000 [IU] | ORAL_TABLET | Freq: Every day | ORAL | Status: DC
  Administered 2020-04-29: 1000 [IU] via ORAL
  Filled 2020-04-29: qty 1

## 2020-04-29 NOTE — TOC Initial Note (Addendum)
Transition of Care [facility]) - Initial/Assessment Note    Patient Details  Name: Ethan Diaz MRN: [id number] Date of Birth: May 12, 1956  Transition of Care [facility]) CM/SW Contact:    Ethan Cousin, RN Phone Number: [phone] 04/29/2020, 12:52 PM  Clinical Narrative:                  TOC CM spoke to pt and pt's dtr, Ethan Diaz. Presented bed offers to pt and dtr (via phone). Dtr states she spoke to pt and he is leaving decision up to her. Dtr went to [facility].gov to review ratings for different facilities provided. Dtr states she will accept bed offer at [facility]. Contacted [facility] Admission Director, Ethan Diaz. She will verify his payor and need additional paperwork for SNF.  Pt wants to use his Medicare insurance for payment of SNF. Updated [facility] AD and that pt had a IP stay in April 10, 2020 that qualifies 3 IP night requirement for Medicare. Dtr will contact [facility] to complete paperwork and notify [facility] to have his DME and belongings sent to [facility].    Pt gave permission to fax dc paperwork to [facility] and [facility], states he has upcoming appts.    Expected Discharge Plan: Skilled Nursing Facility Barriers to Discharge: No Barriers Identified   Patient Goals and CMS Choice Patient states their goals for this hospitalization and ongoing recovery are:: To regain strength and return home CMS Medicare.gov Compare Post Acute Care list provided to:: Patient Represenative (must comment)(daughter, Ethan Diaz) Choice offered to / list presented to : Patient, Adult Children  Expected Discharge Plan and Services Expected Discharge Plan: Skilled Nursing Facility In-house Referral: Clinical Social Work Discharge Planning Services: CM Consult Post Acute Care Choice: Skilled Nursing Facility Living arrangements for the past 2 months: Assisted Living Facility([facility])                 DME Arranged: N/A                     Prior Living Arrangements/Services Living arrangements for the past 2 months: Assisted Living Facility([facility]) Lives with:: Facility Resident Patient language and need for interpreter reviewed:: Yes Do you feel safe going back to the place where you live?: No      Need for Family Participation in Patient Care: Yes (Comment) Care giver support system in place?: Yes (comment) Current home services: DME(scooter) Criminal Activity/Legal Involvement Pertinent to Current Situation/Hospitalization: No - Comment as needed  Activities of Daily Living      Permission Sought/Granted Permission sought to share information with : Case Manager, Facility Medical sales representative, PCP, Family Supports Permission granted to share information with : Yes, Verbal Permission Granted  Share Information with NAME: Ethan Diaz  Permission granted to share info w AGENCY: SNF Rehab[facility]  Permission granted to share info w Relationship: daughter  Permission granted to share info w Contact Information: [phone]  Emotional Assessment Appearance:: Appears stated age Attitude/Demeanor/Rapport: Gracious Affect (typically observed): Accepting Orientation: : Oriented to Self, Oriented to Place, Oriented to  Time, Oriented to Situation   Psych Involvement: No (comment)  Admission diagnosis:  foot pain Patient Active Problem List   Diagnosis Date Noted  . Hyperglycemia 04/08/2020  . 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

## 2020-04-29 NOTE — Progress Notes (Signed)
04/29/2020  1450  Called [facility] to give report. Spoke with Rosalita Chessman gave call back info. Waiting for RN to call back to get report. Notified Rosalita Chessman that [facility] has already been called.

## 2020-04-29 NOTE — Consult Note (Signed)
WOC Nurse Consult Note: Reason for Consult:Chronic nonhealing neuropathic ulcers Wound type: chronic nonhealing neuropathic Pressure Injury POA: Yes Measurement: LEft great metatarsal head on medial aspect foot: 1 cm x 0.3 cm fibrin to wound bed. Right great metatarsal head on medial aspect foot:  0.5 cm round with fibrin to wound bed  Wound bed: wounds are dry and scabbed.  Drainage (amount, consistency, odor) none History of coronary artery disease.  Would like to rule out arterial component and assess vascular perfusion.  Patient states he has an ABI scheduled through the [place] on 05/07/20.  WIll implement appropriate topical intervention until perfusion is assessed. No signs of infection and pain is likely due to neuropathy as patient states wounds have not changed.  Periwound:intact  Dressing procedure/placement/frequency: Cleanse wounds to bilateral great toes with NS and pat dry. Paint wounds with betadine and allow to air dry.  Cover with dry dressing.  Change daily.  Awaiting ABI 05/07/20 Will not follow at this time.  Please re-consult if needed.  Maple Hudson MSN, RN, FNP-BC CWON Wound, Ostomy, Continence Nurse Pager [phone]

## 2020-04-29 NOTE — Evaluation (Signed)
Physical Therapy Evaluation Patient Details Name: Ethan Diaz MRN: [id number] DOB: 05-18-1956 Today's Date: 04/29/2020   History of Present Illness  64 year old male presenting for chronic bilateral foot wound evaluation, H/O sroke with Left hemiplegia, DM with neuropathy.  Clinical Impression  The patient is very pleasant.Min Assist of 1  with ambulating x 10' to Bathroom using his quad cane. Patient indicating increased pain from foot wounds with difficulty turning around. A chair was brought up for patient and rolled back to his bed to where he was able transfer back onto. Patient indicates that he uses his scooter versus ambulating majority of the time.  Pt admitted with above diagnosis.  Pt currently with functional limitations due to the deficits listed below (see PT Problem List). Pt will benefit from skilled PT to increase their independence and safety with mobility to allow discharge to the venue listed below.       Follow Up Recommendations SNF    Equipment Recommendations  None recommended by PT    Recommendations for Other Services       Precautions / Restrictions Precautions Precautions: Fall Precaution Comments: dense l hemi      Mobility  Bed Mobility               General bed mobility comments: sitting on bed edge  Transfers Overall transfer level: Needs assistance Equipment used: Quad cane Transfers: Sit to/from Stand Sit to Stand: Min guard         General transfer comment: close guarding  Ambulation/Gait Ambulation/Gait assistance: Min Chemical engineer (Feet): 10 Feet Assistive device: Quad cane Gait Pattern/deviations: Step-to pattern Gait velocity: decr   General Gait Details: moves very slowly, Steps right leg then drags laeft up to it., Left leg is externally rotated to almost 90* outward. Patient ambulated to toilet, then had difficulty coming out  so chair was brought up  Stairs            Wheelchair Mobility    Modified  Rankin (Stroke Patients Only)       Balance Overall balance assessment: Needs assistance Sitting-balance support: No upper extremity supported;Feet supported Sitting balance-Leahy Scale: Good     Standing balance support: Single extremity supported;During functional activity Standing balance-Leahy Scale: Fair Standing balance comment: leans on wall to urinate standing at toilet                             Pertinent Vitals/Pain Pain Assessment: Faces Faces Pain Scale: Hurts whole lot Pain Location: left foot Pain Descriptors / Indicators: Throbbing;Discomfort Pain Intervention(s): Monitored during session;Patient requesting pain meds-RN notified    Home Living Family/patient expects to be discharged to:: Assisted living               Home Equipment: Cane - quad;Electric scooter      Prior Function Level of Independence: Needs assistance   Gait / Transfers Assistance Needed: uses scooter majority of time, does ambulate with Quad cane short distances  ADL's / Homemaking Assistance Needed: has assistance at ALF        Hand Dominance        Extremity/Trunk Assessment   Upper Extremity Assessment Upper Extremity Assessment: LUE deficits/detail LUE Deficits / Details: flaccid LUE Sensation: history of peripheral neuropathy    Lower Extremity Assessment Lower Extremity Assessment: LLE deficits/detail LLE Deficits / Details: generally flacid, does rotat trunk to advance the leg, medial heel sore LLE Sensation: history of peripheral neuropathy  Communication      Cognition Arousal/Alertness: Awake/alert Behavior During Therapy: WFL for tasks assessed/performed Overall Cognitive Status: Within Functional Limits for tasks assessed                                        General Comments      Exercises     Assessment/Plan    PT Assessment Patient needs continued PT services  PT Problem List Decreased strength;Decreased  balance;Decreased knowledge of precautions;Decreased mobility;Decreased activity tolerance;Decreased safety awareness;Impaired sensation;Decreased skin integrity;Pain       PT Treatment Interventions DME instruction;Functional mobility training;Balance training;Patient/family education;Gait training;Therapeutic activities;Wheelchair mobility training;Therapeutic exercise    PT Goals (Current goals can be found in the Care Plan section)  Acute Rehab PT Goals Patient Stated Goal: to go to a rehab PT Goal Formulation: With patient Time For Goal Achievement: 05/13/20 Potential to Achieve Goals: Good    Frequency Min 2X/week   Barriers to discharge        Co-evaluation               AM-PAC PT "6 Clicks" Mobility  Outcome Measure Help needed turning from your back to your side while in a flat bed without using bedrails?: None Help needed moving from lying on your back to sitting on the side of a flat bed without using bedrails?: None Help needed moving to and from a bed to a chair (including a wheelchair)?: A Little Help needed standing up from a chair using your arms (e.g., wheelchair or bedside chair)?: A Little Help needed to walk in hospital room?: A Lot Help needed climbing 3-5 steps with a railing? : Total 6 Click Score: 17    End of Session   Activity Tolerance: Patient limited by pain Patient left: (seated on bed edge) Nurse Communication: Mobility status PT Visit Diagnosis: Unsteadiness on feet (R26.81);Other symptoms and signs involving the nervous system (R29.898);Hemiplegia and hemiparesis;Pain Hemiplegia - Right/Left: Left Hemiplegia - dominant/non-dominant: Non-dominant Hemiplegia - caused by: Other cerebrovascular disease Pain - Right/Left: Left Pain - part of body: Ankle and joints of foot    Time: 0800-0847 PT Time Calculation (min) (ACUTE ONLY): 47 min   Charges:   PT Evaluation $PT Eval Low Complexity: 1 Low PT Treatments $Gait Training: 8-22  mins $Self Care/Home Management: [place] Pager [phone] Office [phone]   Claretha Cooper 04/29/2020, 8:57 AM

## 2020-04-29 NOTE — Progress Notes (Signed)
04/29/2020  1502  Report Given to RN at [facility].

## 2020-04-29 NOTE — ED Provider Notes (Signed)
Patient with a history of congestive heart failure coronary disease diabetes stroke and multiple skin ulcers.  At the nursing home they should continue his medicines as written on the [facility] to form   Bethann Berkshire, MD 04/29/20 1331

## 2020-04-29 NOTE — Progress Notes (Signed)
CSW left VM for admissions coordinator at [facility] requesting a call back.   Bayard More Sherryle Lis LCSWA Transitions of Care  Clinical Social Worker  Ph: [phone]

## 2020-04-29 NOTE — Progress Notes (Signed)
1st shift CSW received handoff from 2nd shift CSW concerning patient placement. CSW notes that patient has been accepted into [facility]. CSW will contact SNF placement to confirm acceptance.   Ethan Diaz LCSWA Transitions of Care  Clinical Social Worker  Ph: [phone]

## 2020-04-29 NOTE — Progress Notes (Signed)
04/29/2020  1254  Tried calling SW [phone] but the voicemail says her mailbox is full. Pt states he's leaving per SW.

## 2020-04-29 NOTE — Progress Notes (Signed)
04/29/2020 1410  [facility] [phone] called for transport.

## 2020-04-29 NOTE — NC FL2 (Signed)
[place] LEVEL OF CARE SCREENING TOOL     IDENTIFICATION  Patient Name: Ethan Diaz Birthdate: 09-27-1956 Sex: male Admission Date (Current Location): 04/28/2020  [facility] and [place] Number:  Herbalist and Address:  [facility],  [place] [address] [place]      Provider Number: [id number]  Attending Physician Name and Address:  Default, Provider, MD  Relative Name and Phone Number:  Dickie La # [phone]    Current Level of Care: Hospital Recommended Level of Care: [place] Prior Approval Number:    Date Approved/Denied:   PASRR Number: [id number]  Discharge Plan: SNF    Current Diagnoses: Patient Active Problem List   Diagnosis Date Noted  . Hyperglycemia 04/08/2020  . 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    Orientation RESPIRATION BLADDER Height & Weight     Self, Time, Situation, Place  Normal Continent Weight: 66.2 kg Height:  5'6"  BEHAVIORAL SYMPTOMS/MOOD NEUROLOGICAL BOWEL NUTRITION STATUS      Continent Diet(Diabetic)  AMBULATORY STATUS COMMUNICATION OF NEEDS Skin   Extensive Assist Verbally PU Stage and Appropriate Care   PU Stage 2 Dressing: Daily(please follow up with patient's Podiatrist at [facility], has ABI on 05/07/2020)                   [place], Feeding, Dressing Bathing Assistance: Limited assistance Feeding assistance: Independent Dressing Assistance: Limited assistance     Functional Limitations Info  Sight, Hearing, Speech Sight Info: Adequate Hearing Info: Adequate(wears hearing aids) Speech Info: Adequate    SPECIAL CARE FACTORS FREQUENCY  PT (By licensed PT), OT (By licensed OT)     PT Frequency: 5x per week OT Frequency: 5x per week            Contractures Contractures Info: Not present    Additional Factors Info  Code Status, Allergies(check blood sugar tid before meals and qh) Code Status Info:  full code Allergies Info: no known allergies   Insulin Sliding Scale Info: see dc instructions       Current Medications (04/29/2020):  This is the current hospital active medication list Current Facility-Administered Medications  Medication Dose Route Frequency Provider Last Rate Last Admin  . aspirin EC tablet 81 mg  81 mg Oral Daily Milton Ferguson, MD   81 mg at 04/29/20 0907  . atorvastatin (LIPITOR) tablet 80 mg  80 mg Oral QHS Milton Ferguson, MD      . cholecalciferol (VITAMIN D3) tablet 1,000 Units  1,000 Units Oral Daily Milton Ferguson, MD   1,000 Units at 04/29/20 0907  . docusate sodium (COLACE) capsule 100 mg  100 mg Oral Daily Milton Ferguson, MD   100 mg at 04/29/20 0907  . feeding supplement (GLUCERNA 1.2 CAL) liquid 237 mL  237 mL Oral Daily Milton Ferguson, MD   237 mL at 04/29/20 1035  . ferrous sulfate tablet 325 mg  325 mg Oral Q breakfast Milton Ferguson, MD   325 mg at 04/29/20 0906  . [START ON 04/30/2020] furosemide (LASIX) tablet 20 mg  20 mg Oral Gwenith Spitz, MD      . gabapentin (NEURONTIN) capsule 300 mg  300 mg Oral Daily Milton Ferguson, MD   300 mg at 04/29/20 0907  . insulin aspart protamine- aspart (NOVOLOG MIX 70/30) injection 15 Units  15 Units Subcutaneous BID WC Milton Ferguson, MD   15 Units at 04/29/20 1032   Current Outpatient Medications  Medication Sig Dispense Refill  . acetaminophen (TYLENOL) 500 MG tablet Take 500 mg by mouth See admin instructions. 500mg  daily at bedtime 500mg  every 8 hours as needed for pain    . aspirin EC 81 MG tablet Take 162 mg by mouth daily.     atorvastatin (LIPITOR) 80 MG tablet Take 80 mg by mouth at bedtime.    . cholecalciferol (VITAMIN D3) 25 MCG (1000 UNIT) tablet Take 1,000 Units by mouth daily.    docusate sodium (COLACE) 100 MG capsule Take 100 mg by mouth daily.    . empagliflozin (JARDIANCE) 25 MG TABS tablet Take 12.5 mg by mouth daily.    . ferrous sulfate 325 (65 FE) MG tablet Take 325 mg by mouth  daily with breakfast.    . furosemide (LASIX) 20 MG tablet Take 20 mg by mouth every other day.    . gabapentin (NEURONTIN) 400 MG capsule Take 400 mg by mouth 3 (three) times daily.    . Glucerna (GLUCERNA) LIQD Take 237 mLs by mouth every 12 (twelve) hours.     . hydrocortisone cream (PREPARATION H) 1 % Apply 1 application topically every 8 (eight) hours as needed (hemorrhoids).    . insulin aspart protamine- aspart (NOVOLOG MIX 70/30) (70-30) 100 UNIT/ML injection Inject 0.15 mLs (15 Units total) into the skin 2 (two) times daily with a meal. Take 15units in am and 15 units in pm 10 mL 11  . lidocaine (LMX) 4 % cream Apply 1 application topically as needed (infected toe).    . metFORMIN (GLUCOPHAGE) 1000 MG tablet Take 1,000 mg by mouth 2 (two) times daily with a meal.    . Multiple Vitamin (MULTIVITAMIN WITH MINERALS) TABS tablet Take 1 tablet by mouth daily.    . polyethylene glycol (MIRALAX / GLYCOLAX) 17 g packet Take 17 g by mouth daily.       Discharge Medications: Please see discharge summary for a list of discharge medications.  Relevant Imaging Results:  Relevant Lab Results:   Additional Information Type II Diabetes, SS # Marland Kitchen  Marland Kitchen, RN

## 2020-05-14 ENCOUNTER — Encounter: Payer: Self-pay | Admitting: Vascular Surgery

## 2020-05-14 ENCOUNTER — Other Ambulatory Visit: Payer: Self-pay

## 2020-05-14 ENCOUNTER — Ambulatory Visit (INDEPENDENT_AMBULATORY_CARE_PROVIDER_SITE_OTHER): Payer: Medicare Other | Admitting: Vascular Surgery

## 2020-05-14 VITALS — BP 117/82 | HR 97 | Temp 98.3°F | Resp 20 | Ht 66.0 in | Wt 146.0 lb

## 2020-05-14 DIAGNOSIS — I739 Peripheral vascular disease, unspecified: Secondary | ICD-10-CM

## 2020-05-14 NOTE — Progress Notes (Signed)
Referring Physician: Shawn Route  Patient name: Ethan Diaz MRN: [id number] DOB: 04/20/1956 Sex: male  REASON FOR CONSULT: Nonhealing wound left foot  HPI: Ethan Diaz is a 64 y.o. male, with a several month history of a nonhealing wound of his left foot.  Patient's leg on the left side is not usable secondary to a prior stroke.  He thinks he may use this leg a little bit for transfer.  He does not really describe rest pain.  He has no history of claudication.  He mainly gets around in a wheelchair.  Other medical problems include congestive failure coronary artery disease diabetes neuropathy.  All of these are currently stable.  He is on aspirin and a statin.  He has a history of a serum creatinine as high as 1.5.  He is a former smoker.  Past Medical History:  Diagnosis Date  . CHF (congestive heart failure) (HCC)   . Coronary artery disease   . Diabetes mellitus without complication (HCC)   . Hemiparesis (HCC)   . MI, old   . Pancreatitis   . Stroke [facility])    History reviewed. No pertinent surgical history.  History reviewed. No pertinent family history.  SOCIAL HISTORY: Social History   Socioeconomic History  . Marital status: Single    Spouse name: Not on file  . Number of children: Not on file  . Years of education: Not on file  . Highest education level: Not on file  Occupational History  . Not on file  Tobacco Use  . Smoking status: Former Games developer  . Smokeless tobacco: Never Used  Substance and Sexual Activity  . Alcohol use: Yes    Comment: 4oz red wine 2x/day  . Drug use: No  . Sexual activity: Not on file  Other Topics Concern  . Not on file  Social History Narrative  . Not on file   Social Determinants of Health   Financial Resource Strain:   . Difficulty of Paying Living Expenses:   Food Insecurity:   . Worried About Programme researcher, broadcasting/film/video in the Last Year:   . Barista in the Last Year:   Transportation Needs:   . Automotive engineer (Medical):   Marland Kitchen Lack of Transportation (Non-Medical):   Physical Activity:   . Days of Exercise per Week:   . Minutes of Exercise per Session:   Stress:   . Feeling of Stress :   Social Connections:   . Frequency of Communication with Friends and Family:   . Frequency of Social Gatherings with Friends and Family:   . Attends Religious Services:   . Active Member of Clubs or Organizations:   . Attends Banker Meetings:   Marland Kitchen Marital Status:   Intimate Partner Violence:   . Fear of Current or Ex-Partner:   . Emotionally Abused:   Marland Kitchen Physically Abused:   . Sexually Abused:     No Known Allergies  Current Outpatient Medications  Medication Sig Dispense Refill  . acetaminophen (TYLENOL) 500 MG tablet Take 500 mg by mouth See admin instructions. 500mg  daily at bedtime 500mg  every 8 hours as needed for pain    . aspirin EC 81 MG tablet Take 162 mg by mouth daily.     atorvastatin (LIPITOR) 80 MG tablet Take 80 mg by mouth at bedtime.    . cholecalciferol (VITAMIN D3) 25 MCG (1000 UNIT) tablet Take 1,000 Units by mouth daily.    docusate sodium (  COLACE) 100 MG capsule Take 100 mg by mouth daily.    . empagliflozin (JARDIANCE) 25 MG TABS tablet Take 12.5 mg by mouth daily.    . ferrous sulfate 325 (65 FE) MG tablet Take 325 mg by mouth daily with breakfast.    . furosemide (LASIX) 20 MG tablet Take 20 mg by mouth every other day.    . gabapentin (NEURONTIN) 400 MG capsule Take 400 mg by mouth 3 (three) times daily.    . Glucerna (GLUCERNA) LIQD Take 237 mLs by mouth every 12 (twelve) hours.     . hydrocortisone cream (PREPARATION H) 1 % Apply 1 application topically every 8 (eight) hours as needed (hemorrhoids).    . insulin aspart protamine- aspart (NOVOLOG MIX 70/30) (70-30) 100 UNIT/ML injection Inject 0.15 mLs (15 Units total) into the skin 2 (two) times daily with a meal. Take 15units in am and 15 units in pm 10 mL 11  . lidocaine (LMX) 4 % cream Apply 1  application topically as needed (infected toe).    . metFORMIN (GLUCOPHAGE) 1000 MG tablet Take 1,000 mg by mouth 2 (two) times daily with a meal.    . Multiple Vitamin (MULTIVITAMIN WITH MINERALS) TABS tablet Take 1 tablet by mouth daily.    . polyethylene glycol (MIRALAX / GLYCOLAX) 17 g packet Take 17 g by mouth daily.     No current facility-administered medications for this visit.    ROS:   General:  No weight loss, Fever, chills  HEENT: No recent headaches, no nasal bleeding, no visual changes, no sore throat  Neurologic: No dizziness, blackouts, seizures. No recent symptoms of stroke or mini- stroke. No recent episodes of slurred speech, or temporary blindness.  Cardiac: No recent episodes of chest pain/pressure, no shortness of breath at rest.  No shortness of breath with exertion.  Denies history of atrial fibrillation or irregular heartbeat  Vascular: No history of rest pain in feet.  No history of claudication.  No history of non-healing ulcer, No history of DVT   Pulmonary: No home oxygen, no productive cough, no hemoptysis,  No asthma or wheezing  Musculoskeletal:  [ ]  Arthritis, [ ]  Low back pain,  [ ]  Joint pain  Hematologic:No history of hypercoagulable state.  No history of easy bleeding.  No history of anemia  Gastrointestinal: No hematochezia or melena,  No gastroesophageal reflux, no trouble swallowing  Urinary: [X]  chronic Kidney disease, [ ]  on HD - [ ]  MWF or [ ]  TTHS, [ ]  Burning with urination, [ ]  Frequent urination, [ ]  Difficulty urinating;   Skin: No rashes  Psychological: No history of anxiety,  No history of depression   Physical Examination  Vitals:   05/14/20 1014  BP: 117/82  Pulse: 97  Resp: 20  Temp: 98.3 F (36.8 C)  SpO2: 95%  Weight: 146 lb (66.2 kg)  Height: 5\' 6"  (1.676 m)    Body mass index is 23.57 kg/m.  General:  Alert and oriented, no acute distress HEENT: Normal Neck: No JVD Cardiac: Regular Rate and Rhythm  Abdomen: Soft, non-tender, non-distended, no mass Skin: No rash, ulceration over the left first metatarsal head 2 x 2 centimeter diameter 1 mm depth some granulation tissue Extremity Pulses:  2+ radial, brachial, 1+ right absent left femoral, absent dorsalis pedis, posterior tibial pulses bilaterally Musculoskeletal: No deformity or edema  Neurologic: Upper and lower extremity motor 5/5 and symmetric with the exception of the left leg which is flaccid  DATA:  Patient  recently had bilateral ABIs performed on May 15, 2020.  This showed an ABI on the right of 0.7 no left ABI was obtained pressure was 0  ASSESSMENT: Patient with nonhealing wound in the left leg.  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  Patient is nonambulatory and left leg is only used a little bit for transfer.  In light of this if he has treatable percutaneous options would consider this however would not consider him an open operative bypass candidate due to his minimal use of his left leg.  PLAN: Abdominal aortogram lower extremity runoff possible intervention scheduled for May 29, 2020.  Risk benefits possible complications of procedure details discussed with the patient including but not limited to bleeding infection vessel injury contrast reaction renal failure.  We will try to limit his contrast and potentially use some carbon dioxide but would definitely have to use some iodinated contrast if an intervention is performed.  He understands and agrees to proceed.  Fabienne Bruns, MD [facility] Office: [phone] Pager: [phone]

## 2020-05-26 DEATH — deceased

## 2020-05-28 ENCOUNTER — Telehealth: Payer: Self-pay

## 2020-05-28 NOTE — Telephone Encounter (Signed)
Called patient because he did not show up for his Covid test for his procedure. Daughter said that patient had passed away.   Advised the Cath lab and Dr Bailey Mech

## 2020-05-29 ENCOUNTER — Ambulatory Visit (HOSPITAL_COMMUNITY)
Admission: RE | Admit: 2020-05-29 | Payer: No Typology Code available for payment source | Source: Home / Self Care | Admitting: Vascular Surgery

## 2020-05-29 SURGERY — ABDOMINAL AORTOGRAM W/LOWER EXTREMITY
Anesthesia: LOCAL | Laterality: Bilateral

## 2021-12-21 IMAGING — DX DG CHEST 1V PORT
1 series · 1 of 1 positions shown · non-contrast
Comparison: None.

CLINICAL DATA: Elevated glucose

EXAM:
PORTABLE CHEST 1 VIEW

[chest ap]
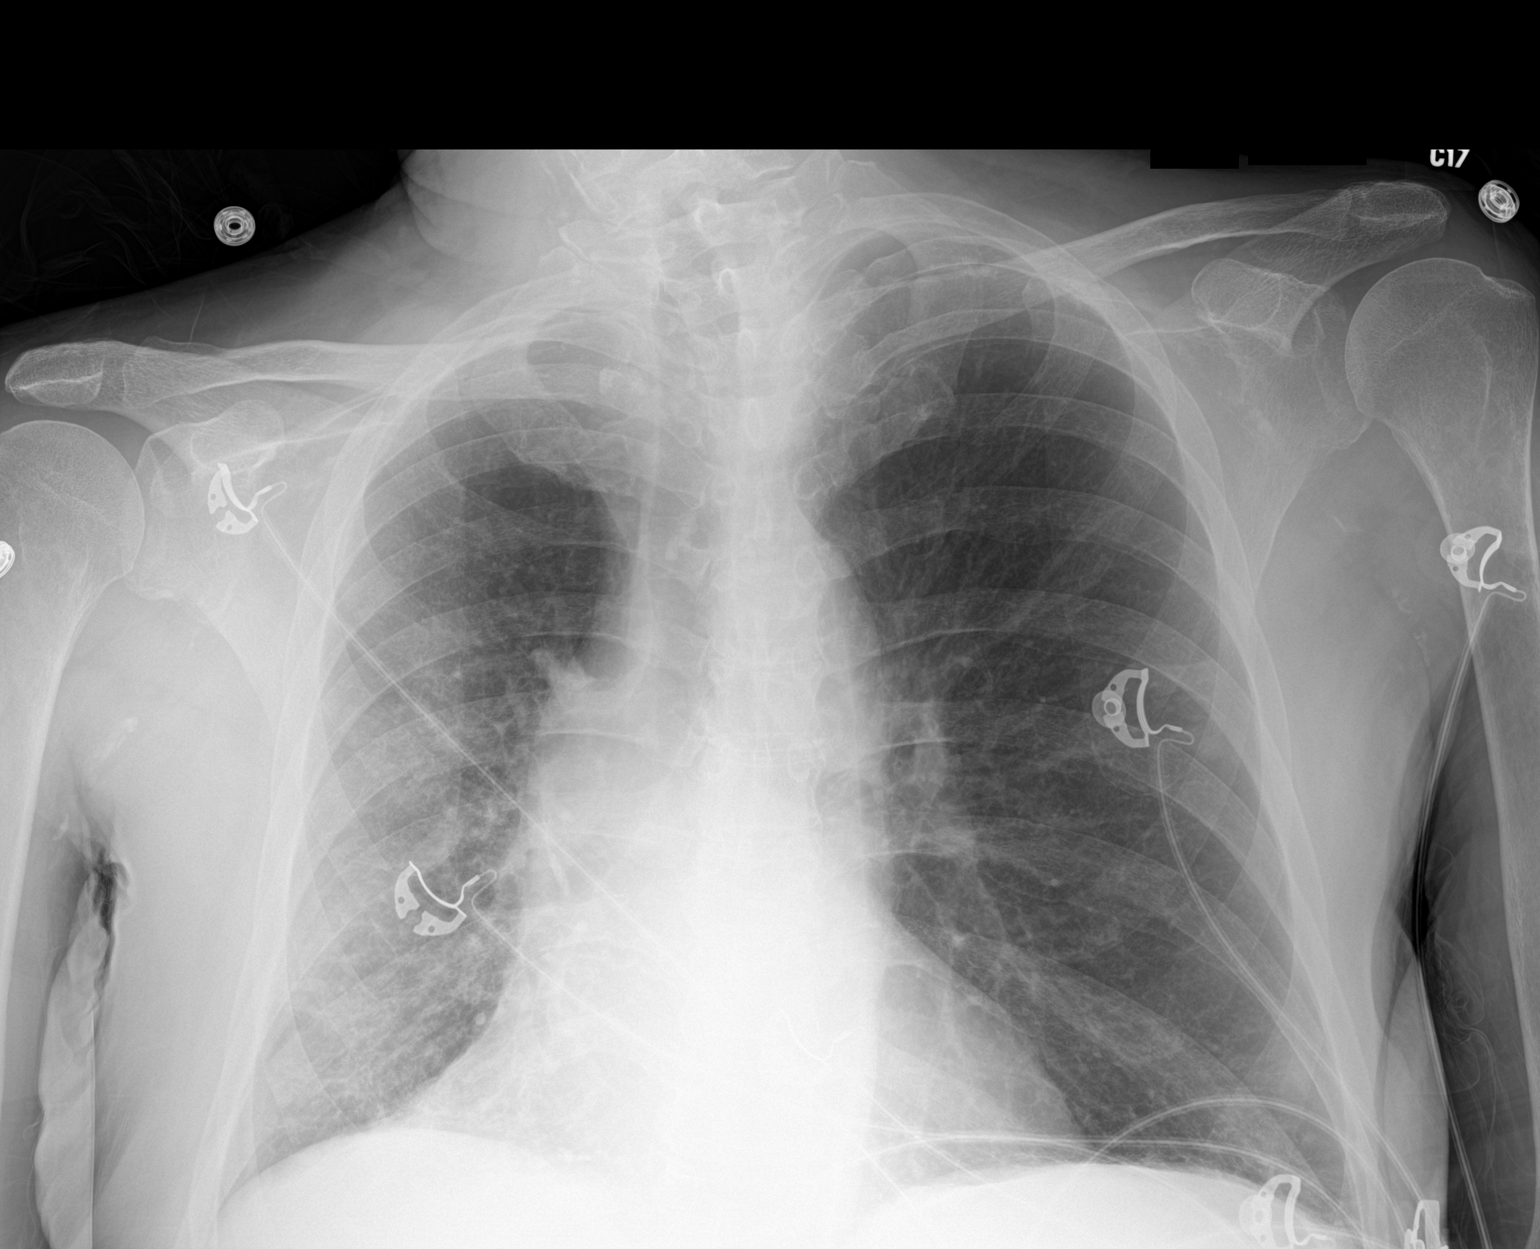

[1 of 1 positions shown; findings below may reference images not displayed]

FINDINGS: Cardiac shadows within normal limits. Aortic calcifications are
noted. The lungs are well aerated bilaterally. Right medial basilar
consolidation is noted with mild volume loss and mediastinal shift
to the right. No bony abnormality is noted.
IMPRESSION: Consolidation in the right medial lung base consistent with acute
infiltrate.
